# Patient Record
Sex: Female | Born: 1960 | Race: White | Hispanic: No | Smoking: Current every day smoker
Health system: Southern US, Community
[De-identification: ages and names within clinical notes are randomized; demographics above are authoritative.]

## PROBLEM LIST (undated history)

## (undated) DIAGNOSIS — M1990 Unspecified osteoarthritis, unspecified site: Secondary | ICD-10-CM

## (undated) DIAGNOSIS — R9089 Other abnormal findings on diagnostic imaging of central nervous system: Secondary | ICD-10-CM

## (undated) DIAGNOSIS — F419 Anxiety disorder, unspecified: Secondary | ICD-10-CM

## (undated) DIAGNOSIS — S92901A Unspecified fracture of right foot, initial encounter for closed fracture: Secondary | ICD-10-CM

## (undated) DIAGNOSIS — G501 Atypical facial pain: Secondary | ICD-10-CM

## (undated) DIAGNOSIS — M549 Dorsalgia, unspecified: Secondary | ICD-10-CM

## (undated) DIAGNOSIS — G8929 Other chronic pain: Secondary | ICD-10-CM

## (undated) DIAGNOSIS — G90519 Complex regional pain syndrome I of unspecified upper limb: Secondary | ICD-10-CM

## (undated) HISTORY — DX: Anxiety disorder, unspecified: F41.9

## (undated) HISTORY — DX: Unspecified fracture of right foot, initial encounter for closed fracture: S92.901A

## (undated) HISTORY — DX: Unspecified osteoarthritis, unspecified site: M19.90

## (undated) HISTORY — DX: Other abnormal findings on diagnostic imaging of central nervous system: R90.89

## (undated) HISTORY — DX: Dorsalgia, unspecified: M54.9

## (undated) HISTORY — DX: Complex regional pain syndrome I of unspecified upper limb: G90.519

## (undated) HISTORY — DX: Atypical facial pain: G50.1

## (undated) HISTORY — DX: Other chronic pain: G89.29

---

## 2001-09-20 ENCOUNTER — Encounter: Admission: RE | Admit: 2001-09-20 | Discharge: 2001-12-12 | Payer: Self-pay | Admitting: Occupational Medicine

## 2002-09-06 ENCOUNTER — Emergency Department (HOSPITAL_COMMUNITY): Admission: EM | Admit: 2002-09-06 | Discharge: 2002-09-06 | Payer: Self-pay | Admitting: Emergency Medicine

## 2007-03-01 ENCOUNTER — Emergency Department (HOSPITAL_COMMUNITY): Admission: EM | Admit: 2007-03-01 | Discharge: 2007-03-01 | Payer: Self-pay | Admitting: Emergency Medicine

## 2010-06-21 HISTORY — PX: ABDOMINAL HYSTERECTOMY: SHX81

## 2010-08-20 ENCOUNTER — Encounter (HOSPITAL_COMMUNITY)
Admission: RE | Admit: 2010-08-20 | Discharge: 2010-08-20 | Disposition: A | Discharge status: Home / Self Care | Payer: 59 | Source: Ambulatory Visit | Attending: Obstetrics and Gynecology | Admitting: Obstetrics and Gynecology

## 2010-08-20 LAB — CBC
HCT: 45.7 % (ref 36.0–46.0)
MCHC: 34.4 g/dL (ref 30.0–36.0)
RDW: 12.5 % (ref 11.5–15.5)

## 2010-08-20 LAB — BASIC METABOLIC PANEL
Calcium: 9.6 mg/dL (ref 8.4–10.5)
Creatinine, Ser: 0.8 mg/dL (ref 0.4–1.2)
GFR calc non Af Amer: >60 mL/min (ref >60)
Glucose, Bld: 120 mg/dL — ABNORMAL HIGH (ref 70–99)
Sodium: 140 mEq/L (ref 135–145)

## 2010-08-27 ENCOUNTER — Other Ambulatory Visit: Payer: Self-pay | Admitting: Obstetrics and Gynecology

## 2010-08-27 ENCOUNTER — Ambulatory Visit (HOSPITAL_COMMUNITY)
Admission: RE | Admit: 2010-08-27 | Discharge: 2010-08-27 | Disposition: A | Discharge status: Home / Self Care | Payer: 59 | Source: Ambulatory Visit | Attending: Obstetrics and Gynecology | Admitting: Obstetrics and Gynecology

## 2010-08-27 DIAGNOSIS — Z01818 Encounter for other preprocedural examination: Secondary | ICD-10-CM | POA: Insufficient documentation

## 2010-08-27 DIAGNOSIS — D259 Leiomyoma of uterus, unspecified: Secondary | ICD-10-CM | POA: Insufficient documentation

## 2010-08-27 DIAGNOSIS — N831 Corpus luteum cyst of ovary, unspecified side: Secondary | ICD-10-CM | POA: Insufficient documentation

## 2010-08-27 DIAGNOSIS — N8000 Endometriosis of the uterus, unspecified: Secondary | ICD-10-CM | POA: Insufficient documentation

## 2010-08-27 DIAGNOSIS — N949 Unspecified condition associated with female genital organs and menstrual cycle: Secondary | ICD-10-CM | POA: Insufficient documentation

## 2010-08-27 DIAGNOSIS — N946 Dysmenorrhea, unspecified: Secondary | ICD-10-CM | POA: Insufficient documentation

## 2010-08-27 DIAGNOSIS — I10 Essential (primary) hypertension: Secondary | ICD-10-CM | POA: Insufficient documentation

## 2010-08-27 DIAGNOSIS — N938 Other specified abnormal uterine and vaginal bleeding: Secondary | ICD-10-CM | POA: Insufficient documentation

## 2010-08-27 DIAGNOSIS — Z01812 Encounter for preprocedural laboratory examination: Secondary | ICD-10-CM | POA: Insufficient documentation

## 2010-08-27 LAB — PREGNANCY, URINE: Preg Test, Ur: NEGATIVE

## 2010-09-11 NOTE — Op Note (Signed)
[name], [name]                 ACCOUNT NO.:  [id number]  MEDICAL RECORD NO.:  [id number]           PATIENT TYPE:  O  LOCATION:  9320                          FACILITY:  [facility]  PHYSICIAN:  Zenaida Niece, M.D.DATE OF BIRTH:  [birth year]  DATE OF PROCEDURE:  08/27/2010 DATE OF DISCHARGE:  08/27/2010                              OPERATIVE REPORT   PREOPERATIVE DIAGNOSES:  Abnormal uterine bleeding and dysmenorrhea.  POSTOPERATIVE DIAGNOSES:  Abnormal uterine bleeding and dysmenorrhea and right ovarian lesion.  PROCEDURE:  Laparoscopic supracervical hysterectomy and removal of right ovarian lesion.  SURGEON:  Zenaida Niece, MD  ASSISTANT:  Huel Cote, MD  ANESTHESIA:  General endotracheal tube.  FINDINGS:  She had a small uterus with normal pelvis except for a 1-2 cm lesion on the end of the right ovary.  SPECIMENS:  Morcellated uterus and right ovarian lesion sent for routine pathology.  ESTIMATED BLOOD LOSS:  Minimal.  COMPLICATIONS:  None.  PROCEDURE IN DETAIL:  The patient was taken to the operating room and placed in the dorsal supine position.  General anesthesia was induced. Both arms were tucked to her sides and legs were placed in mobile stirrups.  Abdomen, perineum, and vagina were then prepped and draped in the usual sterile fashion and a Foley catheter was inserted. Infraumbilical skin was infiltrated with 0.25% Marcaine and a 1-cm vertical incision was made.  The Veress needle was inserted into the peritoneal cavity and placement confirmed by the water drop test and an opening pressure of 6 mmHg.  CO2 was insufflated to a pressure of 12 mmHg and the Veress needle was removed.  A 5-mm trocar was then introduced with direct visualization with the laparoscope.  A 5-mm port was then placed on the right side and a 12-mm port on the left side also under direct visualization.  Inspection revealed the above-mentioned findings.  The left uterine cornu  was grasped with a 5-mm tenaculum from the right side.  The Harmonic scalpel Ace was used to take down the left fallopian tube, left utero-ovarian pedicle, round ligament, broad ligament, and incised across the anterior portion of the uterus. Uterine artery was skeletonized and also taken down with Harmonic scalpel Ace with good hemostasis and good vision.  All bleeding was controlled with Harmonic scalpel Ace.  A similar procedure was performed on the right side taking down the fallopian tube, utero-ovarian pedicle, round ligament, broad ligament, and incising the anterior peritoneum to meet the incision coming from the left side.  Uterine artery was skeletonized and taken down with Harmonic scalpel Ace with adequate division and adequate hemostasis.  A drill, clamped, cut technique was then used with the Harmonic scalpel on maximum power to start removing the uterus from the cervix.  Two bites were taken from the right side. The remainder of bites were taken from the left side, separating the uterus from the cervix.  Small amount of bleeding was controlled with the Harmonic scalpel.  The uterus was placed in the posterior cul-de- sac.  I grasped the distal right fallopian tube with a grasper from the patient's left side.  Using the Harmonic scalpel Ace from the right side, Dr. Senaida Ores was able to remove the ovarian lesion from the end of the right ovary.  Bleeding was controlled with the Harmonic scalpel Ace.  This lesion was then removed through the 12-mm trocar and sent as a separate specimen.  The 12-mm trocar was then removed and the Morcellator was inserted.  The uterus was then morcellated in one piece removing all visible tissue.  The Morcellator was removed and the 12-mm port was replaced.  The pelvis was copiously irrigated and all sites were found to be hemostatic.  There was noted be a small amount of adhesion of the colon to the right lower abdomen.  Some of this  was taken down with Harmonic scalpel Ace.  A piece of Intercede was then placed over the cervical stump.  This noted to be hemostatic.  The 12-mm port was then removed.  A figure-of-eight of 0 Vicryl was placed to reapproximate the fascia while Dr. Senaida Ores observed through the laparoscope to make sure no bowel was included.  This achieved good closure.  The 5-mm trocars were then removed, after gas was allowed to deflate from the abdomen.  Skin incisions were closed with interrupted subcuticular sutures of 4-0 Vicryl followed by Dermabond.  The patient tolerated the procedure well, was extubated and taken to the recovery room in stable condition.  All counts were correct, she received Ancef 1 g IV at the beginning of the procedure and had PAS hose on throughout the procedure.     Zenaida Niece, M.D.     TDM/MEDQ  D:  08/27/2010  T:  08/28/2010  Job:  [id number]  Electronically Signed by Lavina Hamman M.D. on 09/11/2010 08:53:42 AM

## 2010-09-11 NOTE — H&P (Signed)
  [name], [name]                 ACCOUNT NO.:  [id number]  MEDICAL RECORD NO.:  [id number]         PATIENT TYPE:  WAMB  LOCATION:                                [facility]:  [facility]  PHYSICIAN:  Zenaida Niece, M.D.DATE OF BIRTH:  1960/08/25  DATE OF ADMISSION:  08/27/2010 DATE OF DISCHARGE:                             HISTORY & PHYSICAL   CHIEF COMPLAINT:  Abnormal uterine bleeding and dysmenorrhea.  HISTORY OF PRESENT ILLNESS:  This is a 50 year old gravida 0 who was seen for an annual exam in December 2007 by our nurse practitioner.  At that time, she stated that she was having regular periods that were heavy with severe cramping and clotting.  Every 3 months as so, she would have two cycles in 1 month.  Exam at that time was normal. Options were discussed with the patient and she has decided that instead of undergoing endometrial ablation or using medical therapy, she would like a definitive surgical therapy for this.  She did have a pelvic ultrasound which reveals a normal endometrial stripe as I was unable to do an endometrial biopsy in the office.  Plan is to admit the patient on March 8 for a laparoscopic supracervical hysterectomy.  PAST MEDICAL HISTORY:  Borderline hypertension.  PAST SURGICAL HISTORY:  None.  ALLERGIES:  None.  CURRENT MEDICATIONS:  None.  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  FAMILY HISTORY:  No GYN or colon cancer.  REVIEW OF SYSTEMS:  Otherwise negative.  PHYSICAL EXAMINATION:  GENERAL:  This is a well-developed female in no acute distress.  Weight in the office was 137 pounds.  Height is 5 feet 6 inches. NECK:  Supple without lymphadenopathy or thyromegaly. LUNGS:  Clear to auscultation. HEART:  Regular rate and rhythm without murmur. ABDOMEN:  Soft, nontender, nondistended without any palpable masses. EXTREMITIES:  No edema and are  nontender. PELVIC:  External genitalia has no lesions.  Vagina and cervix are normal.  On bimanual exam, uterus is mid planar, nontender, and normal size.  This is confirmed by rectovaginal exam.  ASSESSMENT:  Menorrhagia and dysmenorrhea that has not responded to conservative measures.  All nonsurgical and surgical options have been discussed with the patient and the patient wishes to proceed with definitive surgical therapy with hysterectomy.  All routes of surgery and risks of surgery have been discussed including bleeding, infection, damage to surrounding organs.  The patient wishes to proceed with definitive therapy.  I was unable to do an endometrial biopsy. However, ultrasound reveals a normal endometrial stripe.  Plan is to admit the patient on August 27, 2010 for laparoscopic supracervical hysterectomy.     Zenaida Niece, M.D.     TDM/MEDQ  D:  08/26/2010  T:  08/26/2010  Job:  [id number]  Electronically Signed by Lavina Hamman M.D. on 09/11/2010 08:53:39 AM

## 2011-11-11 ENCOUNTER — Emergency Department: Payer: Self-pay | Admitting: Emergency Medicine

## 2013-01-03 ENCOUNTER — Encounter: Payer: Self-pay | Admitting: Neurology

## 2013-01-03 ENCOUNTER — Ambulatory Visit (INDEPENDENT_AMBULATORY_CARE_PROVIDER_SITE_OTHER): Payer: 59 | Admitting: Neurology

## 2013-01-03 VITALS — BP 153/93 | HR 89 | Ht 66.0 in | Wt 138.0 lb

## 2013-01-03 DIAGNOSIS — G501 Atypical facial pain: Secondary | ICD-10-CM

## 2013-01-03 MED ORDER — GABAPENTIN 300 MG PO CAPS: 300.0000 mg | ORAL_CAPSULE | Freq: Two times a day (BID) | ORAL | Status: DC

## 2013-01-03 NOTE — Progress Notes (Signed)
Reason for visit: Left face pain  [name] is a 52 y.o. female  History of present illness:  [name] is a 52 year old right-handed white female with a history of left maxillary pain that has been present for 16 months. The patient indicates that she does not have superficial pain, but the pain feels as if it is in the anterior maxillary area on the left. The patient had an abscessed tooth, and this was treated. The patient has had multiple dental procedures that includes a root canal and eventual extraction of the tooth. The patient has continued to have pain. The patient indicates that she is sensitive on the roof of the mouth with local pressure, and she has a sensation of pain in the left anterior maxillary area that may radiate into the left angle of the jaw. The patient has pain all the time, but the pain is worse when she is active, better when she rests. There is no sharp, jabbing, or electric quality to the pain. The pain is a burning sensation. The patient gets better with the pain when she is on antibiotics, but the pain comes back as soon as she stops the medication. The patient has had a Panorex view of the jaw, but this did not show any evidence of ongoing infection. The patient is sent to this office for further evaluation. The patient reports no other symptoms of blurred vision, difficulty with speech or swallowing, or numbness or weakness of the face, arms, or legs. The patient denies any problems controlling the bowels or the bladder, and she denies any balance issues. The patient has had no neck stiffness. The patient been treated with nonsteroidal anti-inflammatory medications without benefit.  Past Medical History  Diagnosis Date  . Arthritis   . Chronic back pain   . Anxiety   . Atypical facial pain     Past Surgical History  Procedure Laterality Date  . Abdominal hysterectomy  2012    Family History  Problem Relation Age of Onset  . Coronary artery disease  Father   . Prostate cancer Sister   . Hypertension Brother   . Congestive Heart Failure Mother     Social history:  reports that she has been smoking Cigarettes.  She has been smoking about 2.00 packs per day. She does not have any smokeless tobacco history on file. She reports that  drinks alcohol. She reports that she does not use illicit drugs.  Medications:  No current outpatient prescriptions on file prior to visit.   No current facility-administered medications on file prior to visit.    Allergies: No Known Allergies  ROS:  Out of a complete 14 system review of symptoms, the patient complains only of the following symptoms, and all other reviewed systems are negative.  Fatigue Eczema Feeling hot, flushing Joint pain, joint swelling, achy muscles Anxiety, insomnia, decreased energy  Blood pressure 153/93, pulse 89, height 5\' 6"  (1.676 m), weight 138 lb (62.596 kg).  Physical Exam  General: The patient is alert and cooperative at the time of the examination.  Head: Pupils are equal, round, and reactive to light. Discs are flat bilaterally.  Neck: The neck is supple, no carotid bruits are noted.  Respiratory: The respiratory examination is clear.  Cardiovascular: The cardiovascular examination reveals a regular rate and rhythm, no obvious murmurs or rubs are noted.  Neuromuscular: The patient has good range of movement of the cervical spine. There is no crepitus within the temporomandibular joints.  Skin:  Extremities are without significant edema.  Neurologic Exam  Mental status:  Cranial nerves: Facial symmetry is present. There is good sensation of the face to pinprick and soft touch bilaterally. The strength of the facial muscles and the muscles to head turning and shoulder shrug are normal bilaterally. Speech is well enunciated, no aphasia or dysarthria is noted. Extraocular movements are full. Visual fields are full.  Motor: The motor testing reveals 5 over 5  strength of all 4 extremities. Good symmetric motor tone is noted throughout.  Sensory: Sensory testing is intact to pinprick, soft touch, vibration sensation, and position sense on all 4 extremities. No evidence of extinction is noted.  Coordination: Cerebellar testing reveals good finger-nose-finger and heel-to-shin bilaterally.  Gait and station: Gait is normal. Tandem gait is normal. Romberg is negative. No drift is seen.  Reflexes: Deep tendon reflexes are symmetric and normal bilaterally. Toes are downgoing bilaterally.   Assessment/Plan:  1. Atypical left facial pain  The patient actually appears to have pain in the bone of the maxillary area on the left. The patient will be set up for MRI evaluation of the brain with and without gadolinium enhancement. The pain may not be neuropathic in nature. The patient will be given a trial on gabapentin. The patient indicates that she cannot tolerate steroid therapy. The patient will followup in 3 months.  Marlan Palau MD 01/03/2013 7:08 PM  [facility] [address] [place], [place] [address]  Phone [phone] Fax [phone]

## 2013-01-04 ENCOUNTER — Ambulatory Visit: Payer: 59 | Admitting: Neurology

## 2013-01-10 ENCOUNTER — Ambulatory Visit (INDEPENDENT_AMBULATORY_CARE_PROVIDER_SITE_OTHER): Payer: 59

## 2013-01-10 DIAGNOSIS — G501 Atypical facial pain: Secondary | ICD-10-CM

## 2013-01-11 ENCOUNTER — Telehealth: Payer: Self-pay | Admitting: Neurology

## 2013-01-11 DIAGNOSIS — R9089 Other abnormal findings on diagnostic imaging of central nervous system: Secondary | ICD-10-CM

## 2013-01-11 MED ORDER — GABAPENTIN 300 MG PO CAPS: 300.0000 mg | ORAL_CAPSULE | Freq: Three times a day (TID) | ORAL | Status: DC

## 2013-01-11 NOTE — Telephone Encounter (Signed)
I called patient. I'll call back later. MRI the brain shows some white matter abnormalities. This combined with an atypical facial pain could be suggestive of demyelinating disease. The patient may require lumbar puncture and further blood work. I will need to discuss this with her.

## 2013-01-11 NOTE — Telephone Encounter (Signed)
I called patient. The MRI study shows white matter abnormalities that are nonspecific. Given her history and age, I would recommend pursuing further blood work and a lumbar puncture. The patient indicates that the gabapentin is helping the pain some, but it wears off. I will go up to 300 mg times daily on the gabapentin.

## 2013-01-12 ENCOUNTER — Telehealth: Payer: Self-pay | Admitting: Neurology

## 2013-01-15 ENCOUNTER — Telehealth: Payer: Self-pay | Admitting: Neurology

## 2013-01-15 MED ORDER — GADOPENTETATE DIMEGLUMINE 469.01 MG/ML IV SOLN: 12.0000 mL | Freq: Once | INTRAVENOUS | Status: AC | PRN

## 2013-01-15 NOTE — Telephone Encounter (Signed)
I called and spoke with the patient's sister and I explain to her that dr. Anne Hahn is recommending further testing such as blood work and lumbar puncture. Patient's sister stated if this testing was going to give a diagnosis then her sister(patient) wants to have it done. Please call patient back around 4:00 this afternoon per sister's request.

## 2013-01-15 NOTE — Telephone Encounter (Signed)
I called patient. She was calling to try to get the spinal tap set up. The orders are in place. Someone called this morning to try to schedule this, and she missed the call.

## 2013-01-18 NOTE — Telephone Encounter (Signed)
I called and spoke with patient to confirm appt. for LP on 01-19-13@11 :45.

## 2013-01-19 ENCOUNTER — Ambulatory Visit (INDEPENDENT_AMBULATORY_CARE_PROVIDER_SITE_OTHER): Payer: 59 | Admitting: Neurology

## 2013-01-19 DIAGNOSIS — R930 Abnormal findings on diagnostic imaging of skull and head, not elsewhere classified: Secondary | ICD-10-CM

## 2013-01-19 DIAGNOSIS — G501 Atypical facial pain: Secondary | ICD-10-CM

## 2013-01-19 DIAGNOSIS — R9089 Other abnormal findings on diagnostic imaging of central nervous system: Secondary | ICD-10-CM

## 2013-01-19 MED ORDER — GABAPENTIN 300 MG PO CAPS: 300.0000 mg | ORAL_CAPSULE | Freq: Three times a day (TID) | ORAL | Status: DC

## 2013-01-19 NOTE — Procedures (Signed)
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 The patient has undergone MRI evaluation the brain that shows nonspecific white matter abnormalities. The patient returns to the office for lumbar puncture to evaluate her for the white matter abnormalities. The patient is being evaluated for possible demyelinating disease.  The patient was placed in the fetal position on the right side, and the low back was cleaned with Betadine solution. Approximately 2 cc of 1% Xylocaine was used as a local anesthetic. A 20-gauge spinal needle was inserted into the L3-4 interspace, and approximately 18 cc of clear colorless spinal fluid was removed for testing. Opening pressure was 114 mm of water.  Tube #1 was sent for cryptococcal antigen, VDRL, and angiotensin-converting enzyme level.  Tube #2 was sent for oligoclonal banding, IgG albumin ratio.  Tube #3 was sent for cells, differential, glucose, and protein.  Tube #4 was sent for Lyme antibody panel.  The patient tolerated the procedure well. No complications of the above procedure were noted.

## 2013-01-20 LAB — CELL COUNT, CSF: RBC, CSF: 0 /uL

## 2013-01-20 LAB — GLUCOSE, CEREBROSPINAL FLUID: Glucose, CSF: 76 mg/dL — ABNORMAL HIGH (ref 40–70)

## 2013-01-23 ENCOUNTER — Other Ambulatory Visit: Payer: Self-pay | Admitting: Neurology

## 2013-01-23 ENCOUNTER — Telehealth: Payer: Self-pay | Admitting: Neurology

## 2013-01-23 DIAGNOSIS — G971 Other reaction to spinal and lumbar puncture: Secondary | ICD-10-CM

## 2013-01-23 DIAGNOSIS — R519 Headache, unspecified: Secondary | ICD-10-CM

## 2013-01-23 LAB — CARDIOLIPIN ANTIBODY
Anticardiolipin IgA: <9 APL U/mL (ref 0–11)
Anticardiolipin IgM: <9 MPL U/mL (ref 0–12)

## 2013-01-23 LAB — ANGIOTENSIN CONVERTING ENZYME: Angio Convert Enzyme: 42 U/L (ref 14–82)

## 2013-01-23 LAB — FACTOR 5 LEIDEN

## 2013-01-23 LAB — RHEUMATOID FACTOR: Rhuematoid fact SerPl-aCnc: 8.8 IU/mL (ref 0.0–13.9)

## 2013-01-23 NOTE — Progress Notes (Signed)
The patient has called with a severe spinal headache. I'll set up a blood patch.

## 2013-01-23 NOTE — Telephone Encounter (Signed)
Spoke to [facility] and patient is going for blood patch tomorrow at 1300.

## 2013-01-23 NOTE — Telephone Encounter (Signed)
Patient left message that she had a LP on Friday and now has a terrible headache.  She was told to call if bad headache occurred.  I have tried to reach her on [phone] and [phone] but was only able to leave message.

## 2013-01-24 ENCOUNTER — Ambulatory Visit
Admission: RE | Admit: 2013-01-24 | Discharge: 2013-01-24 | Disposition: A | Discharge status: Home / Self Care | Payer: 59 | Source: Ambulatory Visit | Attending: Neurology | Admitting: Neurology

## 2013-01-24 VITALS — BP 141/84 | HR 67

## 2013-01-24 DIAGNOSIS — G971 Other reaction to spinal and lumbar puncture: Secondary | ICD-10-CM

## 2013-01-24 DIAGNOSIS — R519 Headache, unspecified: Secondary | ICD-10-CM

## 2013-01-24 LAB — IGG CSF INDEX
Albumin CSF-mCnc: 22 mg/dL (ref 11–48)
Albumin: 4.7 g/dL (ref 3.5–5.5)
IgG (Immunoglobin G), Serum: 682 mg/dL — ABNORMAL LOW (ref 700–1600)
IgG Index, CSF: 0.1 (ref 0.0–0.7)
IgG/Albumin Ratio, CSF: 0.01 (ref 0.00–0.25)

## 2013-01-24 LAB — ANGIOTENSIN CONVERTING ENZYME, CSF: Angio Convert Enzyme, CSF: 1.3 U/L (ref 0.0–2.5)

## 2013-01-24 MED ORDER — HYDROCODONE-ACETAMINOPHEN 5-325 MG PO TABS
2.0000 | ORAL_TABLET | Freq: Once | ORAL | Status: AC
  Administered 2013-01-24: 2 via ORAL

## 2013-01-24 MED ORDER — IOHEXOL 180 MG/ML  SOLN
1.0000 mL | Freq: Once | INTRAMUSCULAR | Status: AC | PRN
  Administered 2013-01-24: 1 mL via EPIDURAL

## 2013-01-25 ENCOUNTER — Telehealth: Payer: Self-pay | Admitting: Neurology

## 2013-01-25 LAB — LYME, WESTERN BLOT, CSF
Lyme IgG WB Interp.: NEGATIVE
P18 Ab.: ABSENT
P23 Ab.: ABSENT
P28 Ab.: ABSENT
P30 Ab.: ABSENT
P58 Ab.: ABSENT

## 2013-01-25 NOTE — Telephone Encounter (Signed)
I called the patient. The blood work and spinal fluid results are unremarkable. We may consider doing a transcranial Doppler bubble study in the future to look for a PFO as the source of the white matter lesions of the brain. The MRI of the brain should be followed over time.

## 2013-01-26 ENCOUNTER — Other Ambulatory Visit: Payer: 59

## 2013-05-15 ENCOUNTER — Other Ambulatory Visit: Payer: Self-pay | Admitting: Neurology

## 2013-07-26 ENCOUNTER — Encounter: Payer: Self-pay | Admitting: Neurology

## 2013-07-26 ENCOUNTER — Encounter (INDEPENDENT_AMBULATORY_CARE_PROVIDER_SITE_OTHER): Payer: Self-pay

## 2013-07-26 ENCOUNTER — Ambulatory Visit (INDEPENDENT_AMBULATORY_CARE_PROVIDER_SITE_OTHER): Payer: 59 | Admitting: Neurology

## 2013-07-26 VITALS — BP 161/93 | HR 85 | Wt 141.0 lb

## 2013-07-26 DIAGNOSIS — G501 Atypical facial pain: Secondary | ICD-10-CM

## 2013-07-26 DIAGNOSIS — R9089 Other abnormal findings on diagnostic imaging of central nervous system: Secondary | ICD-10-CM

## 2013-07-26 DIAGNOSIS — R930 Abnormal findings on diagnostic imaging of skull and head, not elsewhere classified: Secondary | ICD-10-CM

## 2013-07-26 HISTORY — DX: Other abnormal findings on diagnostic imaging of central nervous system: R90.89

## 2013-07-26 MED ORDER — DULOXETINE HCL 60 MG PO CPEP: 60.0000 mg | ORAL_CAPSULE | Freq: Every day | ORAL | Status: DC

## 2013-07-26 NOTE — Progress Notes (Signed)
Reason for visit: Atypical facial pain, left  [name] is an 53 y.o. female  History of present illness:  Ms. Herbison is a 53 year old right-handed white female with a two-year history of atypical facial pain on the left following dental procedure. The patient has been noted to have an abnormal MRI of the brain with rounded white matter lesions. Lumbar puncture done previously did not show any abnormalities. The patient has been placed on gabapentin and she believes that this does help the pain some, but the medication effects wear off after 2 hours. The patient is unable to go higher on the dose as the medication makes her jittery. The patient denies any new numbness, weakness, balance problems, or problems controlling the bowels or the bladder. There have been no significant vision changes. The patient returns to this office for further evaluation. The patient does report some numbness around the left cheek.  Past Medical History  Diagnosis Date  . Arthritis   . Chronic back pain   . Anxiety   . Atypical facial pain   . Foot fracture, right   . Abnormal brain MRI 07/26/2013    Past Surgical History  Procedure Laterality Date  . Abdominal hysterectomy  2012    Family History  Problem Relation Age of Onset  . Coronary artery disease Father   . Prostate cancer Sister   . Hypertension Brother   . Congestive Heart Failure Mother     Social history:  reports that she has been smoking Cigarettes.  She has been smoking about 2.00 packs per day. She has never used smokeless tobacco. She reports that she drinks alcohol. She reports that she does not use illicit drugs.   No Known Allergies  Medications:  Current Outpatient Prescriptions on File Prior to Visit  Medication Sig Dispense Refill  . gabapentin (NEURONTIN) 300 MG capsule TAKE ONE CAPSULE BY MOUTH 3 TIMES A DAY  90 capsule  3  . meloxicam (MOBIC) 7.5 MG tablet Take 7.5 mg by mouth 2 (two) times daily.       .  sertraline (ZOLOFT) 25 MG tablet Take 25 mg by mouth daily.      Marland Kitchen triamcinolone cream (KENALOG) 0.1 % Apply 1 application topically 2 (two) times daily.        No current facility-administered medications on file prior to visit.    ROS:  Out of a complete 14 system review of symptoms, the patient complains only of the following symptoms, and all other reviewed systems are negative.  Excessive sweating Facial swelling Visual blurring Back pain Headache, numbness  Blood pressure 161/93, pulse 85, weight 141 lb (63.957 kg).  Physical Exam  General: The patient is alert and cooperative at the time of the examination.  Skin: No significant peripheral edema is noted.   Neurologic Exam  Mental status: The patient is oriented x 3.  Cranial nerves: Facial symmetry is present. Speech is normal, no aphasia or dysarthria is noted. Extraocular movements are full. Visual fields are full. Pupils are equal, round, and reactive to light. Discs are flat bilaterally.  Motor: The patient has good strength in all 4 extremities.  Sensory examination: Soft touch sensation on the face, arms, and legs is symmetric.  Coordination: The patient has good finger-nose-finger and heel-to-shin bilaterally.  Gait and station: The patient has a normal gait. Tandem gait is normal. Romberg is negative. No drift is seen.  Reflexes: Deep tendon reflexes are symmetric.   Assessment/Plan:  1. Atypical facial  pain, left  2. Abnormal MRI brain  The patient will be sent for a repeat MRI of the brain to compare to the one done in July of 2014. If progression of abnormalities are noted, the patient will undergo a transcranial Doppler bubble study and be considered for possible therapy for multiple sclerosis. The patient will be placed on Cymbalta for ongoing discomfort. Samples were given. The patient will followup in 6 months. The patient will contact our office if the medication is not adequate to control her  pain.  Jill Alexanders MD 07/26/2013 7:20 PM  [facility] [address] [place], [place] [address]  Phone [phone] Fax [phone]

## 2013-08-27 ENCOUNTER — Ambulatory Visit
Admission: RE | Admit: 2013-08-27 | Discharge: 2013-08-27 | Disposition: A | Discharge status: Home / Self Care | Payer: 59 | Source: Ambulatory Visit | Attending: Neurology | Admitting: Neurology

## 2013-08-27 DIAGNOSIS — G501 Atypical facial pain: Secondary | ICD-10-CM

## 2013-08-27 DIAGNOSIS — R930 Abnormal findings on diagnostic imaging of skull and head, not elsewhere classified: Secondary | ICD-10-CM

## 2013-08-27 DIAGNOSIS — R9089 Other abnormal findings on diagnostic imaging of central nervous system: Secondary | ICD-10-CM

## 2013-08-28 ENCOUNTER — Telehealth: Payer: Self-pay | Admitting: Neurology

## 2013-08-28 NOTE — Telephone Encounter (Signed)
I called patient. MRI study of the brain does show a moderate level white matter changes in the paraventricular areas. No comparison to the prior study done at [facility] in July 2014. I will try to get comparison done.

## 2013-09-03 ENCOUNTER — Telehealth: Payer: Self-pay | Admitting: Neurology

## 2013-09-03 NOTE — Telephone Encounter (Signed)
I called patient. The most recent MRI the brain that was done appears to be little change from the study done in July 2014. The lesions appear to be stable.  ADDENDUM:  In comparison to prior MRI from 01/10/13, there are minor differences in the appear of the white matter lesions, most likely due to differences in slice acquisition and technical factors. There is some suggestion of slight increase in white matter disease in the current study (axial T2FLAIR views), but on sagittal T2FLAIR the 2 studies are fairly similar.

## 2013-09-15 ENCOUNTER — Other Ambulatory Visit: Payer: Self-pay | Admitting: Neurology

## 2013-11-25 ENCOUNTER — Other Ambulatory Visit: Payer: Self-pay | Admitting: Neurology

## 2013-12-06 ENCOUNTER — Ambulatory Visit (INDEPENDENT_AMBULATORY_CARE_PROVIDER_SITE_OTHER): Payer: 59 | Admitting: Neurology

## 2013-12-06 ENCOUNTER — Ambulatory Visit (INDEPENDENT_AMBULATORY_CARE_PROVIDER_SITE_OTHER): Payer: Self-pay

## 2013-12-06 DIAGNOSIS — M79601 Pain in right arm: Secondary | ICD-10-CM

## 2013-12-06 DIAGNOSIS — Z0289 Encounter for other administrative examinations: Secondary | ICD-10-CM

## 2013-12-06 DIAGNOSIS — M79609 Pain in unspecified limb: Secondary | ICD-10-CM

## 2013-12-06 NOTE — Procedures (Signed)
     HISTORY:  [name] is a 53 year old patient with a history with a history of right wrist and finger swelling and discomfort that began in April of 2015. The patient is being evaluated for possible neuropathy or a cervical radiculopathy as the etiology for her current symptoms.  NERVE CONDUCTION STUDIES:  Nerve conduction studies were performed on both upper extremities. The distal motor latencies and motor amplitudes for the median and ulnar nerves were within normal limits. The F wave latencies and nerve conduction velocities for these nerves were also normal. The sensory latencies for the median and ulnar nerves were normal.   EMG STUDIES:  EMG study was performed on the right upper extremity:  The first dorsal interosseous muscle reveals 2 to 4 K units with full recruitment. No fibrillations or positive waves were noted. The abductor pollicis brevis muscle reveals 2 to 4 K units with full recruitment. No fibrillations or positive waves were noted. The extensor indicis proprius muscle reveals 1 to 3 K units with full recruitment. No fibrillations or positive waves were noted. The pronator teres muscle reveals 2 to 3 K units with full recruitment. No fibrillations or positive waves were noted. The biceps muscle reveals 1 to 2 K units with full recruitment. No fibrillations or positive waves were noted. The triceps muscle reveals 2 to 4 K units with full recruitment. No fibrillations or positive waves were noted. The anterior deltoid muscle reveals 2 to 3 K units with full recruitment. No fibrillations or positive waves were noted. The cervical paraspinal muscles were tested at 2 levels. No abnormalities of insertional activity were seen at either level tested. There was good relaxation.   IMPRESSION:  Nerve conduction studies done on both upper extremities were within normal limits. No evidence of a neuropathy is seen. EMG evaluation of the right upper extremity is normal,  without evidence of an overlying cervical radiculopathy.  Jill Alexanders MD 12/06/2013 10:53 AM  [facility] [address] [place], [place] [address]  Phone [phone] Fax [phone]

## 2013-12-18 ENCOUNTER — Other Ambulatory Visit: Payer: Self-pay | Admitting: Rheumatology

## 2013-12-18 DIAGNOSIS — M25531 Pain in right wrist: Secondary | ICD-10-CM

## 2014-01-01 ENCOUNTER — Ambulatory Visit
Admission: RE | Admit: 2014-01-01 | Discharge: 2014-01-01 | Disposition: A | Discharge status: Home / Self Care | Payer: 59 | Source: Ambulatory Visit | Attending: Rheumatology | Admitting: Rheumatology

## 2014-01-01 DIAGNOSIS — M25531 Pain in right wrist: Secondary | ICD-10-CM

## 2014-01-07 ENCOUNTER — Other Ambulatory Visit: Payer: Self-pay | Admitting: Neurology

## 2014-02-14 ENCOUNTER — Ambulatory Visit: Payer: 59 | Admitting: Neurology

## 2014-03-03 ENCOUNTER — Other Ambulatory Visit: Payer: Self-pay | Admitting: Neurology

## 2014-03-07 ENCOUNTER — Ambulatory Visit (INDEPENDENT_AMBULATORY_CARE_PROVIDER_SITE_OTHER): Payer: 59 | Admitting: Neurology

## 2014-03-07 ENCOUNTER — Encounter: Payer: Self-pay | Admitting: Neurology

## 2014-03-07 ENCOUNTER — Encounter (INDEPENDENT_AMBULATORY_CARE_PROVIDER_SITE_OTHER): Payer: Self-pay

## 2014-03-07 VITALS — BP 146/84 | HR 69 | Wt 158.0 lb

## 2014-03-07 DIAGNOSIS — R9089 Other abnormal findings on diagnostic imaging of central nervous system: Secondary | ICD-10-CM

## 2014-03-07 DIAGNOSIS — G90511 Complex regional pain syndrome I of right upper limb: Secondary | ICD-10-CM

## 2014-03-07 DIAGNOSIS — G501 Atypical facial pain: Secondary | ICD-10-CM

## 2014-03-07 DIAGNOSIS — R930 Abnormal findings on diagnostic imaging of skull and head, not elsewhere classified: Secondary | ICD-10-CM

## 2014-03-07 DIAGNOSIS — G90519 Complex regional pain syndrome I of unspecified upper limb: Secondary | ICD-10-CM

## 2014-03-07 HISTORY — DX: Complex regional pain syndrome I of unspecified upper limb: G90.519

## 2014-03-07 MED ORDER — TOPIRAMATE 25 MG PO TABS: ORAL_TABLET | ORAL | Status: DC

## 2014-03-07 NOTE — Patient Instructions (Signed)

## 2014-03-07 NOTE — Progress Notes (Signed)
Reason for visit: Atypical facial pain  [name] is an 53 y.o. 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 The patient was placed on Cymbalta, and she seemed to get benefit with the medication, but she had significant weight gain on the drug, and within the last month, she has tapered herself off of the medication. She has gained 17 pounds while on the medication. The patient unfortunately has developed what is felt to be a complex regional pain syndrome in April, 2015 involving the right upper extremity, mainly the hand and wrist. The patient is being followed by Dr. Vira Blanco, a pain physician, and she is receiving sympathetic nerve blocks for the complex regional pain syndrome. So far, this has not been completely effective. The patient is on codeine for pain as well, and this does help some. The patient returns to this office for an evaluation. The patient did have nerve conduction studies and EMG evaluation previously that were unremarkable. She indicates that she did have MRI evaluation of the cervical spine, but she does not remember where this study was done. The patient indicates that she was told that the MRI was unremarkable. She returns to this office for an evaluation.  Past Medical History  Diagnosis Date  . Arthritis   . Chronic back pain   . Anxiety   . Atypical facial pain   . Foot fracture, right   . Abnormal brain MRI 07/26/2013  . 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 Abdominal hysterectomy  2012    Family History  Problem Relation Age of Onset  . Coronary artery disease Father   . Prostate cancer Sister   . Hypertension Brother   . Congestive Heart Failure Mother     Social history:  reports that she has been smoking Cigarettes.  She has been smoking about  2.00 packs per day. She has never used smokeless tobacco. She reports that she drinks alcohol. She reports that she does not use illicit drugs.   No Known Allergies  Medications:  Current Outpatient Prescriptions on File Prior to Visit  Medication Sig Dispense Refill  . gabapentin (NEURONTIN) 300 MG capsule TAKE ONE CAPSULE BY MOUTH 3 TIMES A DAY  90 capsule  6  . meloxicam (MOBIC) 7.5 MG tablet Take 7.5 mg by mouth 2 (two) times daily.       . sertraline (ZOLOFT) 25 MG tablet Take 25 mg by mouth daily.      Marland Kitchen triamcinolone cream (KENALOG) 0.1 % Apply 1 application topically 2 (two) times daily.        No current facility-administered medications on file prior to visit.    ROS:  Out of a complete 14 system review of symptoms, the patient complains only of the following symptoms, and all other reviewed systems are negative.  Joint pain, joint swelling  Blood pressure 146/84, pulse 69, weight 158 lb (71.668 kg).  Physical Exam  General: The patient is alert and cooperative at the time of the examination.  Skin: No significant peripheral edema is noted.   Neurologic Exam  Mental status: The patient is oriented x 3.  Cranial nerves: Facial symmetry is present. Speech is normal, no aphasia or dysarthria is noted. Extraocular movements are full. Visual fields are full.  Motor: The  patient has good strength in all 4 extremities.  Sensory examination: Soft touch sensation is symmetric, legs, slight decrease in soft touch sensation on the right hand relative to the left. Soft touch sensation is symmetric on the face.  Coordination: The patient has good finger-nose-finger and heel-to-shin bilaterally.  Gait and station: The patient has a normal gait. Tandem gait is normal. Romberg is negative. No drift is seen.  Reflexes: Deep tendon reflexes are symmetric.   MRI brain 08/28/13:   IMPRESSION:  Abnormal MRI brain (without) demonstrating;  1. Mild-moderate periventricular and  subcortical and juxtacortical T2 hyperintensities. These findings are non-specific and considerations include autoimmune, inflammatory, post-infectious, microvascular ischemic or migraine associated etiologies.  2. No acute findings.    Assessment/Plan:  1. Atypical facial pain, left mandibular area  2. Complex regional pain syndrome, right upper extremity  The patient has had significant weight gain on the Cymbalta. For this reason, she will be switched to Topamax for the pain to see if this is beneficial. The patient will work up to a 75 mg daily dose taking 1 in the morning and 2 in evening. The patient will followup in about 3-4 months. She will contact me if she is not tolerating the medication, or if she feels that the dose needs to be increased.  Jill Alexanders MD 03/07/2014 9:21 AM  [facility] [address] [place], [place] [address]  Phone [phone] Fax [phone]

## 2014-07-01 ENCOUNTER — Other Ambulatory Visit: Payer: Self-pay | Admitting: Neurology

## 2014-07-08 ENCOUNTER — Ambulatory Visit: Payer: 59 | Admitting: Adult Health

## 2014-07-16 ENCOUNTER — Encounter: Payer: Self-pay | Admitting: Adult Health

## 2014-07-16 ENCOUNTER — Ambulatory Visit (INDEPENDENT_AMBULATORY_CARE_PROVIDER_SITE_OTHER): Payer: 59 | Admitting: Adult Health

## 2014-07-16 VITALS — BP 141/82 | HR 62 | Ht 66.0 in | Wt 148.0 lb

## 2014-07-16 DIAGNOSIS — R9089 Other abnormal findings on diagnostic imaging of central nervous system: Secondary | ICD-10-CM

## 2014-07-16 DIAGNOSIS — R930 Abnormal findings on diagnostic imaging of skull and head, not elsewhere classified: Secondary | ICD-10-CM

## 2014-07-16 DIAGNOSIS — G501 Atypical facial pain: Secondary | ICD-10-CM

## 2014-07-16 DIAGNOSIS — G90511 Complex regional pain syndrome I of right upper limb: Secondary | ICD-10-CM

## 2014-07-16 NOTE — Progress Notes (Signed)
PATIENT: [name] DOB: 04-Oct-1960  REASON FOR VISIT: follow up-atypical face pain, regional pain syndrome, abnormal MRI of the brain HISTORY FROM: patient  HISTORY OF PRESENT ILLNESS: [name] is a 54 year old female with a history of atypical face pain and regional pain syndrome in the right upper extremities. She is followed by a pain specialist and has been given sympathetic nerve blocks which has not always been beneficial. She can't have any additional blocks. She has just been taking pain medication NORCO to control the pain. The patient has was placed on Topamax for facial pain and reports that it has helped the pain. She will have intermittent pain but states that it is tolerable. She states that the Norco has also helped with the facial pain. She states that she plans to retire in May and at that time she plans to come off the pain medication. She has just been using it to be able to function at her job. No new medical history since last seen.   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 The patient was placed on Cymbalta, and she seemed to get benefit with the medication, but she had significant weight gain on the drug, and within the last month, she has tapered herself off of the medication. She has gained 17 pounds while on the medication. The patient unfortunately has developed what is felt to be a complex regional pain syndrome in April, 2015 involving the right upper extremity, mainly the hand and wrist. The patient is being followed by Dr. Vira Blanco, a pain physician, and she is receiving sympathetic nerve blocks for the complex regional pain syndrome. So far, this has not been completely effective. The patient is on codeine for pain as well, and this does help some. The patient returns to this office for an evaluation. The patient  did have nerve conduction studies and EMG evaluation previously that were unremarkable. She indicates that she did have MRI evaluation of the cervical spine, but she does not remember where this study was done. The patient indicates that she was told that the MRI was unremarkable. She returns to this office for an evaluation.  REVIEW OF SYSTEMS: Out of a complete 14 system review of symptoms, the patient complains only of the following symptoms, and all other reviewed systems are negative.  Joint pain, joint swelling  ALLERGIES: No Known Allergies  HOME MEDICATIONS: Outpatient Prescriptions Prior to Visit  Medication Sig Dispense Refill  . gabapentin (NEURONTIN) 300 MG capsule TAKE ONE CAPSULE BY MOUTH 3 TIMES A DAY 90 capsule 6  . HYDROcodone-acetaminophen (NORCO) 10-325 MG per tablet Take 4 tablets by mouth daily as needed.    . meloxicam (MOBIC) 7.5 MG tablet Take 7.5 mg by mouth 2 (two) times daily.     . sertraline (ZOLOFT) 25 MG tablet Take 25 mg by mouth daily.    Marland Kitchen topiramate (TOPAMAX) 25 MG tablet 1 TABLET EVERY DAY FOR ONCE WEEK.THEN 1 TAB TWICE A DAY FOR WEEK. 1 IN THE MORNING & 2 IN EVENING 90 tablet 0  . triamcinolone cream (KENALOG) 0.1 % Apply 1 application topically 2 (two) times daily.      No facility-administered medications prior to visit.    PAST MEDICAL HISTORY: Past Medical History  Diagnosis Date  . Arthritis   . Chronic back pain   .  Anxiety   . Atypical facial pain   . Foot fracture, right   . Abnormal brain MRI 07/26/2013  . 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 Abdominal hysterectomy  2012    FAMILY HISTORY: Family History  Problem Relation Age of Onset  . Coronary artery disease Father   . Prostate cancer Sister   . Hypertension Brother   . Congestive Heart Failure Mother        PHYSICAL EXAM  Filed Vitals:   07/16/14 1517  BP: 141/82  Pulse: 62  Height: 5\' 6"  (1.676  m)  Weight: 148 lb (67.132 kg)   Body mass index is 23.9 kg/(m^2).  Generalized: Well developed, in no acute distress   Neurological examination  Mentation: Alert oriented to time, place, history taking. Follows all commands speech and language fluent Cranial nerve II-XII: Pupils were equal round reactive to light. Extraocular movements were full, visual field were full on confrontational test. Facial sensation and strength were normal. Uvula tongue midline. Head turning and shoulder shrug  were normal and symmetric. Motor: The motor testing reveals 5 over 5 strength of all 4 extremities. Good symmetric motor tone is noted throughout.  Sensory: Sensory testing is intact to soft touch on all 4 extremities. No evidence of extinction is noted.  Coordination: Cerebellar testing reveals good finger-nose-finger and heel-to-shin bilaterally.  Gait and station: Gait is normal. Tandem gait is normal. Romberg is negative. No drift is seen.  Reflexes: Deep tendon reflexes are symmetric and normal bilaterally.     DIAGNOSTIC DATA (LABS, IMAGING, TESTING) - I reviewed patient records, labs, notes, testing and imaging myself where available.  MRI BRAIN W Wo CONTRAST 08/28/13: Impression: Abnormal MRI brain (without) demonstrating; 1. Mild-moderate periventricular and subcortical and juxtacortical T2 hyperintensities. These findings are non-specific and considerations include autoimmune, inflammatory, post-infectious, microvascular ischemic or migraine associated etiologies.  2. No acute findings. In comparison to prior MRI from 01/10/13, there are minor differences in the appear of the white matter lesions, most likely due to differences in slice acquisition and technical factors. There is some suggestion of slight increase in white matter disease in the current study (axial T2FLAIR views), but on sagittal T2FLAIR the 2 studies are fairly similar.  ASSESSMENT AND PLAN 54 y.o. year old female  has a past  medical history of Arthritis; Chronic back pain; Anxiety; Atypical facial pain; Foot fracture, right; Abnormal brain MRI (07/26/2013); and RSD upper limb (03/07/2014). here with:   1. Atypical face pain 2.Regional pain syndrome in the right upper extremity 3. Abnormal MRI of the brain  Overall the patient is doing about the same. She is using the Norco to control the pain in the right upper extremity. The patient continues to use Topamax for facial pain. She has found it beneficial. No refills needed today.Patient's MRI of the brain was abnormal but stable in 2015. We will continue to monitor. If her symptoms worsen or she develops new symptoms she should let us know. Otherwise she will follow up in 6 months or sooner if     Ward Givens, MSN, NP-C 07/16/2014, 3:26 PM [facility] [address] [place], [place] [address] [phone]  Note: This document was prepared with digital dictation and possible smart phrase technology. Any transcriptional errors that result from this process are unintentional.

## 2014-07-16 NOTE — Progress Notes (Signed)
I have read the note, and I agree with the clinical assessment and plan.  Aksh Swart KEITH   

## 2014-07-16 NOTE — Patient Instructions (Signed)
Continue Topamax.  In the future we can increase Topamax if needed.  Please call if your symptoms worsen or you develop new symptoms.

## 2014-07-28 ENCOUNTER — Other Ambulatory Visit: Payer: Self-pay | Admitting: Neurology

## 2014-09-29 ENCOUNTER — Other Ambulatory Visit: Payer: Self-pay | Admitting: Neurology

## 2014-09-29 NOTE — Telephone Encounter (Signed)
Originally prescribed at this dose per phone note from 07/24

## 2014-10-29 ENCOUNTER — Other Ambulatory Visit: Payer: Self-pay | Admitting: Neurology

## 2015-01-20 ENCOUNTER — Ambulatory Visit (INDEPENDENT_AMBULATORY_CARE_PROVIDER_SITE_OTHER): Payer: 59 | Admitting: Adult Health

## 2015-01-20 ENCOUNTER — Encounter: Payer: Self-pay | Admitting: Adult Health

## 2015-01-20 VITALS — BP 136/85 | HR 62 | Ht 66.0 in | Wt 142.0 lb

## 2015-01-20 DIAGNOSIS — G501 Atypical facial pain: Secondary | ICD-10-CM

## 2015-01-20 DIAGNOSIS — G90511 Complex regional pain syndrome I of right upper limb: Secondary | ICD-10-CM

## 2015-01-20 MED ORDER — TOPIRAMATE 25 MG PO TABS: 50.0000 mg | ORAL_TABLET | Freq: Two times a day (BID) | ORAL | Status: DC

## 2015-01-20 NOTE — Progress Notes (Signed)
PATIENT: Colleen [name] DOB: 12/26/1960  REASON FOR VISIT: follow up- atypical facial pain, regional pain syndrome HISTORY FROM: patient  HISTORY OF PRESENT ILLNESS: Colleen [name] is a 54 year old female with a history of atypical facial pain and regional pain syndrome in the right upper extremity. She returns today for follow-up. She continues to take Topamax and gabapentin. She reports that this medication has eliminated her facial pain. She does state that usually by mid afternoon the pain will start to come back. The pain is normally located on the left side of the face in the nasal fold. The patient has retired and still uses Norco for severe pain. She states that she has tried to cut back but tends to have severe pain in the right hand. She states that she is considering using a TENS unit. She denies any new symptoms. She returns today for an evaluation.  HISTORY 07/16/14: Colleen [name] is a 54 year old female with a history of atypical face pain and regional pain syndrome in the right upper extremities. She is followed by a pain specialist and has been given sympathetic nerve blocks which has not always been beneficial. She can't have any additional blocks. She has just been taking pain medication NORCO to control the pain. The patient has was placed on Topamax for facial pain and reports that it has helped the pain. She will have intermittent pain but states that it is tolerable. She states that the Norco has also helped with the facial pain. She states that she plans to retire in May and at that time she plans to come off the pain medication. She has just been using it to be able to function at her job. No new medical history since last seen.   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 The patient was placed on Cymbalta, and she seemed to get  benefit with the medication, but she had significant weight gain on the drug, and within the last month, she has tapered herself off of the medication. She has gained 17 pounds while on the medication. The patient unfortunately has developed what is felt to be a complex regional pain syndrome in April, 2015 involving the right upper extremity, mainly the hand and wrist. The patient is being followed by Dr. Vira Blanco, a pain physician, and she is receiving sympathetic nerve blocks for the complex regional pain syndrome. So far, this has not been completely effective. The patient is on codeine for pain as well, and this does help some. The patient returns to this office for an evaluation. The patient did have nerve conduction studies and EMG evaluation previously that were unremarkable. She indicates that she did have MRI evaluation of the cervical spine, but she does not remember where this study was done. The patient indicates that she was told that the MRI was unremarkable. She returns to this office for an evaluation.  REVIEW OF SYSTEMS: Out of a complete 14 system review of symptoms, the patient complains only of the following symptoms, and all other reviewed systems are negative.  See history of present illness  ALLERGIES: Allergies  Allergen Reactions  . Prednisone     HOME MEDICATIONS: Outpatient Prescriptions Prior to Visit  Medication Sig Dispense Refill  . gabapentin (NEURONTIN) 300 MG capsule TAKE ONE CAPSULE BY MOUTH 3 TIMES A DAY 90 capsule 6  .  HYDROcodone-acetaminophen (NORCO) 10-325 MG per tablet Take 4 tablets by mouth daily as needed.    . meloxicam (MOBIC) 7.5 MG tablet Take 7.5 mg by mouth 2 (two) times daily.     Marland Kitchen topiramate (TOPAMAX) 25 MG tablet 1 TABLET EVERY DAY FOR ONCE WEEK.THEN 1 TAB TWICE A DAY FOR WEEK. 1 IN THE MORNING & 2 IN EVENING (Patient taking differently: 1 tab in a.m 2 tabs in evening) 90 tablet 6  . triamcinolone cream (KENALOG) 0.1 % Apply 1 application  topically 2 (two) times daily.     . sertraline (ZOLOFT) 25 MG tablet Take 25 mg by mouth daily.    Marland Kitchen gabapentin (NEURONTIN) 300 MG capsule TAKE ONE CAPSULE BY MOUTH 3 TIMES A DAY 90 capsule 6  . topiramate (TOPAMAX) 25 MG tablet 1 TABLET EVERY DAY FOR ONCE WEEK.THEN 1 TAB TWICE A DAY FOR WEEK. 1 IN THE MORNING & 2 IN EVENING 90 tablet 6   No facility-administered medications prior to visit.    PAST MEDICAL HISTORY: Past Medical History  Diagnosis Date  . Arthritis   . Chronic back pain   . Anxiety   . Atypical facial pain   . Foot fracture, right   . Abnormal brain MRI 07/26/2013  . 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 Abdominal hysterectomy  2012    FAMILY HISTORY: Family History  Problem Relation Age of Onset  . Coronary artery disease Father   . Prostate cancer Sister   . Hypertension Brother   . Congestive Heart Failure Mother     SOCIAL HISTORY: History   Social History  . Marital Status: Divorced    Spouse Name: N/A  . Number of Children: 0  . Years of Education: N/A   Occupational History  .  [employer] Main Topics  . Smoking status: Current Every Day Smoker -- 2.00 packs/day    Types: Cigarettes  . Smokeless tobacco: Never Used  . Alcohol Use: Yes     Comment: Consumes beer on the weekends only  . Drug Use: No  . Sexual Activity: Not on file   Other Topics Concern  . Not on file   Social History Narrative      PHYSICAL EXAM  Filed Vitals:   01/20/15 1505  BP: 136/85  Pulse: 62  Height: 5\' 6"  (1.676 m)  Weight: 142 lb (64.411 kg)   Body mass index is 22.93 kg/(m^2).  Generalized: Well developed, in no acute distress   Neurological examination  Mentation: Alert oriented to time, place, history taking. Follows all commands speech and language fluent Cranial nerve II-XII: Pupils were equal round reactive to light. Extraocular movements were full,  visual field were full on confrontational test. Facial sensation and strength were normal. Uvula tongue midline. Head turning and shoulder shrug  were normal and symmetric. Motor: The motor testing reveals 5 over 5 strength of all 4 extremities. Good symmetric motor tone is noted throughout.  Sensory: Sensory testing is intact to soft touch on all 4 extremities. No evidence of extinction is noted.  Coordination: Cerebellar testing reveals good finger-nose-finger and heel-to-shin bilaterally.  Gait and station: Gait is normal. Tandem gait is normal. Romberg is negative. No drift is seen.  Reflexes: Deep tendon reflexes are symmetric and normal bilaterally.   DIAGNOSTIC DATA (LABS, IMAGING, TESTING) - I reviewed patient records, labs, notes, testing and imaging myself where available.  ASSESSMENT AND PLAN 55 y.o. year old female  has a past medical history of Arthritis; Chronic back pain; Anxiety; Atypical facial pain; Foot fracture, right; Abnormal brain MRI (07/26/2013); and RSD upper limb (03/07/2014). here with:  1. Atypical facial pain 2. Regional pain syndrome in the right extremity  I will increase the patient's Topamax 25 mg to 2 tablets twice a day. She will continue taking gabapentin 300 mg 3 times a day. She will let us know if this is beneficial for her facial pain. If so we can change her Topamax to 50 mg tablets instead of 25 mg tablets. Patient will continue to follow up with pain specialist in regards to her right extremity pain. Patient advised that if her symptoms worsen or she develops new symptoms she she'll let us know. Otherwise she will follow-up in 6 months or sooner if needed    Ward Givens, MSN, NP-C 01/20/2015, 3:23 PM [facility] [address] [place], [place] [address] [phone]  Note: This document was prepared with digital dictation and possible smart phrase technology. Any transcriptional errors that result from this process  are unintentional.

## 2015-01-20 NOTE — Progress Notes (Signed)
I have read the note, and I agree with the clinical assessment and plan.  Lynne Takemoto KEITH   

## 2015-01-20 NOTE — Patient Instructions (Signed)
Increase Topamax to 2 tablets (50 mg)  Twice a day.  If this is beneficial we can call in the 50 mg tablet for your next refill. If your symptoms worsen or you develop new symptoms please let us know.

## 2015-01-27 ENCOUNTER — Telehealth: Payer: Self-pay | Admitting: Adult Health

## 2015-01-27 MED ORDER — TOPIRAMATE 50 MG PO TABS: 50.0000 mg | ORAL_TABLET | Freq: Two times a day (BID) | ORAL | Status: DC

## 2015-01-27 NOTE — Telephone Encounter (Signed)
Patient called requesting the topiramate (TOPAMAX) 25 MG tablet the 1 in am and 2 in pm is working well. She stated Jinny Blossom had said she would call in 50mg  if all was going well. Patient can be reached at [phone].

## 2015-01-27 NOTE — Telephone Encounter (Signed)
Last OV note says: Increase Topamax to 2 tablets (50 mg) Twice a day.  If this is beneficial we can call in the 50 mg tablet for your next refill Rx has been updated and sent.  I called the patient back to advise.  Got no answer.  Left message.

## 2015-03-24 ENCOUNTER — Other Ambulatory Visit: Payer: Self-pay

## 2015-03-24 ENCOUNTER — Telehealth: Payer: Self-pay | Admitting: Neurology

## 2015-03-24 MED ORDER — TOPIRAMATE 50 MG PO TABS: 50.0000 mg | ORAL_TABLET | Freq: Two times a day (BID) | ORAL | Status: DC

## 2015-03-24 NOTE — Telephone Encounter (Signed)
I called Melanie at [facility] to clarify Topamax dose as well. Rx for Topamax 50 mg twice daily faxed to [facility]. Confirmation received.

## 2015-03-24 NOTE — Telephone Encounter (Signed)
[name] with [facility] is calling and would like clarification for Rx Topiramate 50 mg.  She states patient's says her dosage has changed to 2 tablets morning and night.  Please call.

## 2015-03-24 NOTE — Telephone Encounter (Signed)
Called patient to clarify Topamax dosage. Patient currently taking two 25mg  tabs of Topamax bid. Megan signed Rx for one 50mg  tab bid. Patinet was grateful.

## 2015-04-01 ENCOUNTER — Telehealth (HOSPITAL_COMMUNITY): Payer: Self-pay | Admitting: *Deleted

## 2015-04-07 ENCOUNTER — Other Ambulatory Visit (HOSPITAL_COMMUNITY): Payer: Self-pay | Admitting: Internal Medicine

## 2015-04-07 DIAGNOSIS — R079 Chest pain, unspecified: Secondary | ICD-10-CM

## 2015-04-08 ENCOUNTER — Inpatient Hospital Stay (HOSPITAL_COMMUNITY): Admission: RE | Admit: 2015-04-08 | Payer: Self-pay | Source: Ambulatory Visit

## 2015-04-10 ENCOUNTER — Telehealth (HOSPITAL_COMMUNITY): Payer: Self-pay

## 2015-04-10 NOTE — Telephone Encounter (Signed)
Encounter complete. 

## 2015-04-15 ENCOUNTER — Ambulatory Visit (HOSPITAL_COMMUNITY)
Admission: RE | Admit: 2015-04-15 | Discharge: 2015-04-15 | Disposition: A | Discharge status: Home / Self Care | Payer: 59 | Source: Ambulatory Visit | Attending: Cardiology | Admitting: Cardiology

## 2015-04-15 DIAGNOSIS — M79601 Pain in right arm: Secondary | ICD-10-CM | POA: Diagnosis not present

## 2015-04-15 DIAGNOSIS — R51 Headache: Secondary | ICD-10-CM | POA: Insufficient documentation

## 2015-04-15 DIAGNOSIS — R079 Chest pain, unspecified: Secondary | ICD-10-CM | POA: Diagnosis not present

## 2015-04-15 DIAGNOSIS — F17200 Nicotine dependence, unspecified, uncomplicated: Secondary | ICD-10-CM | POA: Insufficient documentation

## 2015-04-15 DIAGNOSIS — Z8249 Family history of ischemic heart disease and other diseases of the circulatory system: Secondary | ICD-10-CM | POA: Insufficient documentation

## 2015-04-15 LAB — MYOCARDIAL PERFUSION IMAGING
CHL CUP NUCLEAR SRS: 0
CHL CUP NUCLEAR SSS: 3
LV dias vol: 93 mL
LV sys vol: 31 mL
Peak HR: 74 {beats}/min
Rest HR: 51 {beats}/min
SDS: 3
TID: 1.05

## 2015-04-15 MED ORDER — TECHNETIUM TC 99M SESTAMIBI GENERIC - CARDIOLITE
32.1000 | Freq: Once | INTRAVENOUS | Status: AC | PRN
  Administered 2015-04-15: 32.1 via INTRAVENOUS

## 2015-04-15 MED ORDER — TECHNETIUM TC 99M SESTAMIBI GENERIC - CARDIOLITE
10.8000 | Freq: Once | INTRAVENOUS | Status: AC | PRN
  Administered 2015-04-15: 10.8 via INTRAVENOUS

## 2015-04-15 MED ORDER — REGADENOSON 0.4 MG/5ML IV SOLN
0.4000 mg | Freq: Once | INTRAVENOUS | Status: AC
  Administered 2015-04-15: 0.4 mg via INTRAVENOUS

## 2015-07-23 ENCOUNTER — Ambulatory Visit (INDEPENDENT_AMBULATORY_CARE_PROVIDER_SITE_OTHER): Payer: 59 | Admitting: Adult Health

## 2015-07-23 ENCOUNTER — Encounter: Payer: Self-pay | Admitting: Adult Health

## 2015-07-23 VITALS — BP 126/68 | HR 67 | Ht 66.0 in | Wt 142.0 lb

## 2015-07-23 DIAGNOSIS — G501 Atypical facial pain: Secondary | ICD-10-CM | POA: Diagnosis not present

## 2015-07-23 DIAGNOSIS — G90511 Complex regional pain syndrome I of right upper limb: Secondary | ICD-10-CM | POA: Diagnosis not present

## 2015-07-23 MED ORDER — GABAPENTIN 300 MG PO CAPS: 300.0000 mg | ORAL_CAPSULE | Freq: Three times a day (TID) | ORAL | Status: DC

## 2015-07-23 MED ORDER — TOPIRAMATE 50 MG PO TABS: 50.0000 mg | ORAL_TABLET | Freq: Two times a day (BID) | ORAL | Status: DC

## 2015-07-23 NOTE — Progress Notes (Signed)
I have read the note, and I agree with the clinical assessment and plan.  Kizzy Olafson KEITH   

## 2015-07-23 NOTE — Progress Notes (Signed)
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 She returns today for follow-up. The patient's Topamax was increased to 50 mg twice a day. She states that she very rarely has any facial pain. She continues to use the Norco for pain in the right extremity. She states that she will begin uses a spinal stimulator--doing a trial Initially to see if it is beneficial for her pain. Patient states that she has no new neurological symptoms. She returns today for an evaluation.  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 She returns today for follow-up. She continues to take Topamax and gabapentin. She reports that this medication has eliminated her facial pain. She does state that usually by mid afternoon the pain will start to come back. The pain is normally located on the left side of the face in the nasal fold. The patient has retired and still uses Norco for severe pain. She states that she has tried to cut back but tends to have severe pain in the right hand. She states that she is considering using a TENS unit. She denies any new symptoms. She returns today for an evaluation  REVIEW OF SYSTEMS: Out of a complete 14 system review of symptoms, the patient complains only of the following symptoms, and all other reviewed systems are negative.  Diarrhea, daytime sleepiness, fatigue  ALLERGIES: Allergies  Allergen Reactions  . Prednisone     HOME MEDICATIONS: Outpatient Prescriptions Prior to Visit  Medication Sig Dispense Refill  . HYDROcodone-acetaminophen (NORCO) 10-325 MG per tablet Take 4 tablets by mouth daily as needed.    . meloxicam (MOBIC)  7.5 MG tablet Take 7.5 mg by mouth 2 (two) times daily.     . sertraline (ZOLOFT) 50 MG tablet Take 50 mg by mouth daily.   3  . triamcinolone cream (KENALOG) 0.1 % Apply 1 application topically 2 (two) times daily.     Marland Kitchen gabapentin (NEURONTIN) 300 MG capsule TAKE ONE CAPSULE BY MOUTH 3 TIMES A DAY 90 capsule 6  . topiramate (TOPAMAX) 50 MG tablet Take 1 tablet (50 mg total) by mouth 2 (two) times daily. 60 tablet 5   No facility-administered medications prior to visit.    PAST MEDICAL HISTORY: Past Medical History  Diagnosis Date  . Arthritis   . Chronic back pain   . Anxiety   . Atypical facial pain   . Foot fracture, right   . Abnormal brain MRI 07/26/2013  . 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 Abdominal hysterectomy  2012    FAMILY HISTORY: Family History  Problem Relation Age of Onset  . Coronary artery disease Father   . Prostate cancer Sister   . Hypertension Brother   . Congestive Heart Failure Mother     SOCIAL HISTORY: Social History   Social History  . Marital Status: Divorced    Spouse Name: N/A  . Number of Children: 0  . Years of Education: N/A   Occupational History  .  [employer] History Main Topics  . Smoking status: Current Every Day Smoker -- 2.00 packs/day  Types: Cigarettes  . Smokeless tobacco: Never Used  . Alcohol Use: Yes     Comment: Consumes beer on the weekends only  . Drug Use: No  . Sexual Activity: Not on file   Other Topics Concern  . Not on file   Social History Narrative      PHYSICAL EXAM  Filed Vitals:   07/23/15 1258  BP: 126/68  Pulse: 67  Height: 5' 6" (1.676 m)  Weight: 142 lb (64.411 kg)   Body mass index is 22.93 kg/(m^2).  Generalized: Well developed, in no acute distress   Neurological examination  Mentation: Alert oriented to time, place, history taking. Follows all commands speech and language fluent Cranial  nerve II-XII: Pupils were equal round reactive to light. Extraocular movements were full, visual field were full on confrontational test. Facial sensation and strength were normal. Uvula tongue midline. Head turning and shoulder shrug  were normal and symmetric. Motor: The motor testing reveals 5 over 5 strength of all 4 extremities. Good symmetric motor tone is noted throughout.  Sensory: Sensory testing is intact to soft touch on all 4 extremities. No evidence of extinction is noted.  Coordination: Cerebellar testing reveals good finger-nose-finger and heel-to-shin bilaterally.  Gait and station: Gait is normal. Tandem gait is normal. Romberg is negative. No drift is seen.  Reflexes: Deep tendon reflexes are symmetric and normal bilaterally.   DIAGNOSTIC DATA (LABS, IMAGING, TESTING) - I reviewed patient records, labs, notes, testing and imaging myself where available.  Lab Results  Component Value Date   WBC 10.2 08/20/2010   HGB 15.7* 08/20/2010   HCT 45.7 08/20/2010   MCV 94.4 08/20/2010   PLT 195 08/20/2010      Component Value Date/Time   NA 140 08/20/2010 0903   K 4.4 08/20/2010 0903   CL 106 08/20/2010 0903   CO2 23 08/20/2010 0903   GLUCOSE 120* 08/20/2010 0903   BUN 12 08/20/2010 0903   CREATININE 0.80 08/20/2010 0903   CALCIUM 9.6 08/20/2010 0903   ALBUMIN 4.7 01/19/2013 1318   GFRNONAA >60 08/20/2010 0903   GFRAA  08/20/2010 0903    >60        The eGFR has been calculated using the MDRD equation. This calculation has not been validated in all clinical situations. eGFR's persistently <60 mL/min signify possible Chronic Kidney Disease.      ASSESSMENT AND PLAN 55 y.o. year old female  has a past medical history of Arthritis; Chronic back pain; Anxiety; Atypical facial pain; Foot fracture, right; Abnormal brain MRI (07/26/2013); and RSD upper limb (03/07/2014). here with:  1. Atypical facial pain 2. Regional pain syndrome right upper extremity  Overall the  patient is doing well. She will continue on Topamax 50 mg twice a day and gabapentin 300 mg 3 times a day. Patient advised that if her facial pain returns or worsens she should let us know. She will follow-up in 6 months with Dr. Jannifer Franklin.   Ward Givens, MSN, NP-C 07/23/2015, 1:31 PM [facility] [address], [place], [place] [address] [phone]

## 2015-07-23 NOTE — Patient Instructions (Signed)
Continue Topamax If your symptoms worsen or you develop new symptoms please let us know.   

## 2015-09-16 ENCOUNTER — Telehealth: Payer: Self-pay

## 2015-09-16 MED ORDER — TOPIRAMATE 50 MG PO TABS: 50.0000 mg | ORAL_TABLET | Freq: Two times a day (BID) | ORAL | Status: DC

## 2015-09-16 NOTE — Telephone Encounter (Signed)
Request sent to [place] to re-send Rx

## 2016-01-20 ENCOUNTER — Encounter: Payer: Self-pay | Admitting: Neurology

## 2016-01-20 ENCOUNTER — Ambulatory Visit (INDEPENDENT_AMBULATORY_CARE_PROVIDER_SITE_OTHER): Payer: 59 | Admitting: Neurology

## 2016-01-20 VITALS — BP 149/76 | HR 62 | Ht 65.0 in | Wt 138.0 lb

## 2016-01-20 DIAGNOSIS — G501 Atypical facial pain: Secondary | ICD-10-CM | POA: Diagnosis not present

## 2016-01-20 DIAGNOSIS — G90511 Complex regional pain syndrome I of right upper limb: Secondary | ICD-10-CM

## 2016-01-20 MED ORDER — GABAPENTIN 300 MG PO CAPS: 300.0000 mg | ORAL_CAPSULE | Freq: Three times a day (TID) | ORAL | 6 refills | Status: DC

## 2016-01-20 NOTE — Progress Notes (Signed)
Reason for visit: Atypical facial pain  [name] is an 55 y.o. female  History of present illness:  [name] is a 55 year old right-handed white female with a history of atypical facial pain involving the left maxillary area. The patient has had fairly good improvement of the pain with a combination of gabapentin and Topamax. The patient also has right hand discomfort that is felt to be related to a complex regional pain syndrome. The patient is followed through a pain center, she recently had a trial with a spinal stimulator that was not effective. She has been placed on Lyrica 50 mg daily with good improvement of the hand pain. The patient remains on gabapentin. The patient denies any other new medical issues that have come up since last seen. She returns for an evaluation.  Past Medical History:  Diagnosis Date  . Abnormal brain MRI 07/26/2013  . Anxiety   . Arthritis   . Atypical facial pain   . Chronic back pain   . Foot fracture, right   . 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 ABDOMINAL HYSTERECTOMY  2012    Family History  Problem Relation Age of Onset  . Coronary artery disease Father   . Prostate cancer Sister   . Hypertension Brother   . Congestive Heart Failure Mother     Social history:  reports that she has been smoking Cigarettes.  She has been smoking about 2.00 packs per day. She has never used smokeless tobacco. She reports that she drinks alcohol. She reports that she does not use drugs.    Allergies  Allergen Reactions  . Prednisone     Medications:  Prior to Admission medications   Medication Sig Start Date End Date Taking? Authorizing Provider  AMRIX 15 MG 24 hr capsule  12/25/15  Yes Historical Provider, MD  gabapentin (NEURONTIN) 300 MG capsule Take 1 capsule (300 mg total) by mouth 3 (three) times daily. 01/20/16  Yes Kathrynn Ducking, MD  HYDROcodone-acetaminophen [facility]) 10-325 MG per tablet Take  4 tablets by mouth daily as needed. 02/21/14  Yes Historical Provider, MD  LYRICA 50 MG capsule 50 mg daily.  01/03/16  Yes Historical Provider, MD  meloxicam (MOBIC) 7.5 MG tablet Take 7.5 mg by mouth 2 (two) times daily.  11/15/12  Yes Historical Provider, MD  sertraline (ZOLOFT) 50 MG tablet Take 50 mg by mouth daily.  12/25/14  Yes Historical Provider, MD  topiramate (TOPAMAX) 50 MG tablet Take 1 tablet (50 mg total) by mouth 2 (two) times daily. 09/16/15  Yes Kathrynn Ducking, MD  triamcinolone cream (KENALOG) 0.1 % Apply 1 application topically 2 (two) times daily.  11/15/12  Yes Historical Provider, MD    ROS:  Out of a complete 14 system review of symptoms, the patient complains only of the following symptoms, and all other reviewed systems are negative.  Fatigue Joint pain, joint swelling  Blood pressure (!) 149/76, pulse 62, height 5\' 5"  (1.651 m), weight 138 lb (62.6 kg).  Physical Exam  General: The patient is alert and cooperative at the time of the examination.  Skin: No significant peripheral edema is noted.   Neurologic Exam  Mental status: The patient is alert and oriented x 3 at the time of the examination. The patient has apparent normal recent and remote memory, with an apparently normal attention span and concentration ability.   Cranial nerves: Facial symmetry is present.  Speech is normal, no aphasia or dysarthria is noted. Extraocular movements are full. Visual fields are full.  Motor: The patient has good strength in all 4 extremities.  Sensory examination: Soft touch sensation is symmetric on the face, arms, and legs, with exception of some decrease in soft touch sensation on the right hand.  Coordination: The patient has good finger-nose-finger and heel-to-shin bilaterally.  Gait and station: The patient has a normal gait. Tandem gait is normal. Romberg is negative. No drift is seen.  Reflexes: Deep tendon reflexes are symmetric.   Assessment/Plan:  1.  Atypical facial pain, left maxillary  2. Right hand pain, complex regional pain syndrome  The patient is doing better with the facial pain and she has gained improvement with Lyrica with the right hand pain. The patient is on low doses of gabapentin and Lyrica. Higher doses of gabapentin have resulted in drowsiness. The patient will follow-up through this office in 6 months, we will continue the Topamax and the gabapentin, a prescription for the gabapentin was called in.  Jill Alexanders MD 01/20/2016 11:19 AM  [facility] [address] [place], [place] [address]  Phone [phone] Fax [phone]

## 2016-06-25 ENCOUNTER — Encounter: Payer: Self-pay | Admitting: Adult Health

## 2016-06-30 DIAGNOSIS — Z0000 Encounter for general adult medical examination without abnormal findings: Secondary | ICD-10-CM | POA: Diagnosis not present

## 2016-06-30 DIAGNOSIS — R7302 Impaired glucose tolerance (oral): Secondary | ICD-10-CM | POA: Diagnosis not present

## 2016-06-30 DIAGNOSIS — G90519 Complex regional pain syndrome I of unspecified upper limb: Secondary | ICD-10-CM | POA: Diagnosis not present

## 2016-06-30 DIAGNOSIS — G5600 Carpal tunnel syndrome, unspecified upper limb: Secondary | ICD-10-CM | POA: Diagnosis not present

## 2016-07-14 DIAGNOSIS — Z79899 Other long term (current) drug therapy: Secondary | ICD-10-CM | POA: Diagnosis not present

## 2016-07-14 DIAGNOSIS — M79641 Pain in right hand: Secondary | ICD-10-CM | POA: Diagnosis not present

## 2016-07-14 DIAGNOSIS — Z79891 Long term (current) use of opiate analgesic: Secondary | ICD-10-CM | POA: Diagnosis not present

## 2016-07-14 DIAGNOSIS — G90519 Complex regional pain syndrome I of unspecified upper limb: Secondary | ICD-10-CM | POA: Diagnosis not present

## 2016-07-14 DIAGNOSIS — G894 Chronic pain syndrome: Secondary | ICD-10-CM | POA: Diagnosis not present

## 2016-07-16 DIAGNOSIS — J302 Other seasonal allergic rhinitis: Secondary | ICD-10-CM | POA: Diagnosis not present

## 2016-07-16 DIAGNOSIS — G90511 Complex regional pain syndrome I of right upper limb: Secondary | ICD-10-CM | POA: Diagnosis not present

## 2016-07-16 DIAGNOSIS — Z1389 Encounter for screening for other disorder: Secondary | ICD-10-CM | POA: Diagnosis not present

## 2016-07-16 DIAGNOSIS — Z0000 Encounter for general adult medical examination without abnormal findings: Secondary | ICD-10-CM | POA: Diagnosis not present

## 2016-07-22 ENCOUNTER — Ambulatory Visit: Payer: 59 | Admitting: Adult Health

## 2016-07-27 DIAGNOSIS — M79641 Pain in right hand: Secondary | ICD-10-CM | POA: Diagnosis not present

## 2016-07-27 DIAGNOSIS — G894 Chronic pain syndrome: Secondary | ICD-10-CM | POA: Diagnosis not present

## 2016-07-27 DIAGNOSIS — G5600 Carpal tunnel syndrome, unspecified upper limb: Secondary | ICD-10-CM | POA: Diagnosis not present

## 2016-08-03 ENCOUNTER — Encounter: Payer: Self-pay | Admitting: Adult Health

## 2016-08-03 ENCOUNTER — Ambulatory Visit (INDEPENDENT_AMBULATORY_CARE_PROVIDER_SITE_OTHER): Payer: 59 | Admitting: Adult Health

## 2016-08-03 VITALS — BP 144/83 | HR 59 | Ht 65.0 in | Wt 135.8 lb

## 2016-08-03 DIAGNOSIS — G90511 Complex regional pain syndrome I of right upper limb: Secondary | ICD-10-CM | POA: Diagnosis not present

## 2016-08-03 DIAGNOSIS — G501 Atypical facial pain: Secondary | ICD-10-CM | POA: Diagnosis not present

## 2016-08-03 MED ORDER — TOPIRAMATE 50 MG PO TABS: 75.0000 mg | ORAL_TABLET | Freq: Two times a day (BID) | ORAL | 11 refills | Status: DC

## 2016-08-03 NOTE — Progress Notes (Signed)
I have read the note, and I agree with the clinical assessment and plan.  [name],[name]   

## 2016-08-03 NOTE — Patient Instructions (Signed)
Increase Topamax 75 mg twice a day Can take 600 mg of gabapentin at lunch and dinner if needed If your symptoms worsen or you develop new symptoms please let us know.

## 2016-08-03 NOTE — Progress Notes (Signed)
PATIENT: [name] DOB: [birth year]  REASON FOR VISIT: follow up- atypical facial pain HISTORY FROM: patient  HISTORY OF PRESENT ILLNESS: [name] is a 56 year old female with a history of atypical facial pain involving the left maxilla area. She returns today for follow-up. She continues on Topamax 50 mg twice a day as well as gabapentin 300 mg 3 times a day. She states  recently she is having more pain in in the left side of the face. She states that several times a week she's had to take an additional gabapentin at lunch and dinner. She states that it is beneficial however it does make her sleepy. She states that she is no longer taking Lyrica since it was similar to gabapentin and very expensive. She reports that she has an appointment with her dentist today. She wants to ensure that she has no dental issues that is causing or contributing to the pain. She returns today for an evaluation.  HISTORY 01/20/16 (willis): [name] is a 56 year old right-handed white female with a history of atypical facial pain involving the left maxillary area. The patient has had fairly good improvement of the pain with a combination of gabapentin and Topamax. The patient also has right hand discomfort that is felt to be related to a complex regional pain syndrome. The patient is followed through a pain center, she recently had a trial with a spinal stimulator that was not effective. She has been placed on Lyrica 50 mg daily with good improvement of the hand pain. The patient remains on gabapentin. The patient denies any other new medical issues that have come up since last seen. She returns for an evaluation.   REVIEW OF SYSTEMS: Out of a complete 14 system review of symptoms, the patient complains only of the following symptoms, and all other reviewed systems are negative.  Back pain, ringing in ears, fatigue  ALLERGIES: Allergies  Allergen Reactions  . Prednisone     HOME MEDICATIONS: Outpatient  Medications Prior to Visit  Medication Sig Dispense Refill  . gabapentin (NEURONTIN) 300 MG capsule Take 1 capsule (300 mg total) by mouth 3 (three) times daily. 90 capsule 6  . HYDROcodone-acetaminophen (NORCO) 10-325 MG per tablet Take 4 tablets by mouth daily as needed.    . meloxicam (MOBIC) 7.5 MG tablet Take 7.5 mg by mouth 2 (two) times daily.     . sertraline (ZOLOFT) 50 MG tablet Take 50 mg by mouth daily.   3  . topiramate (TOPAMAX) 50 MG tablet Take 1 tablet (50 mg total) by mouth 2 (two) times daily. 60 tablet 11  . triamcinolone cream (KENALOG) 0.1 % Apply 1 application topically 2 (two) times daily.     . AMRIX 15 MG 24 hr capsule Take 15 mg by mouth daily as needed.     Marland Kitchen LYRICA 50 MG capsule 50 mg daily.      No facility-administered medications prior to visit.     PAST MEDICAL HISTORY: Past Medical History:  Diagnosis Date  . Abnormal brain MRI 07/26/2013  . Anxiety   . Arthritis   . Atypical facial pain   . Chronic back pain   . Foot fracture, right   . 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 ABDOMINAL HYSTERECTOMY  2012    FAMILY HISTORY: Family History  Problem Relation Age of Onset  . Coronary artery disease Father   . Prostate cancer Sister   .  Hypertension Brother   . Congestive Heart Failure Mother     SOCIAL HISTORY: Social History   Social History  . Marital status: Divorced    Spouse name: N/A  . Number of children: 0  . Years of education: N/A   Occupational History  .  [employer] History Main Topics  . Smoking status: Current Every Day Smoker    Packs/day: 2.00    Types: Cigarettes  . Smokeless tobacco: Never Used  . Alcohol use Yes     Comment: Consumes beer on the weekends only  . Drug use: No  . Sexual activity: Not on file   Other Topics Concern  . Not on file   Social History Narrative  . No narrative on file      PHYSICAL  EXAM  Vitals:   08/03/16 1124  BP: (!) 144/83  Pulse: (!) 59  Weight: 135 lb 12.8 oz (61.6 kg)  Height: 5\' 5"  (1.651 m)   Body mass index is 22.6 kg/m.  Generalized: Well developed, in no acute distress   Neurological examination  Mentation: Alert oriented to time, place, history taking. Follows all commands speech and language fluent Cranial nerve II-XII: Pupils were equal round reactive to light. Extraocular movements were full, visual field were full on confrontational test. Facial sensation and strength were normal. Uvula tongue midline. Head turning and shoulder shrug  were normal and symmetric. Motor: The motor testing reveals 5 over 5 strength of all 4 extremities. Good symmetric motor tone is noted throughout.  Sensory: Sensory testing is intact to soft touch on all 4 extremities. No evidence of extinction is noted.  Coordination: Cerebellar testing reveals good finger-nose-finger and heel-to-shin bilaterally.  Gait and station: Gait is normal.  Reflexes: Deep tendon reflexes are symmetric and normal bilaterally.   DIAGNOSTIC DATA (LABS, IMAGING, TESTING) - I reviewed patient records, labs, notes, testing and imaging myself where available.       ASSESSMENT AND PLAN 55 y.o. year old female  has a past medical history of Abnormal brain MRI (07/26/2013); Anxiety; Arthritis; Atypical facial pain; Chronic back pain; Foot fracture, right; and RSD upper limb (03/07/2014). here with:  1. Atypical facial pain 2. Regional pain syndrome right upper extremity   The patient is experiencing more facial pain. We will increase Topamax to 75 mg twice a day. She is advised that she can take 600 mg of gabapentin at lunch and dinner if needed. Patient advised that if her pain does not improve she should let us know. She will follow-up in 6 months or sooner if needed.    Ward Givens, MSN, NP-C 08/03/2016, 11:37 AM [facility] [address] [place], [place]  [address] [phone]

## 2016-08-11 DIAGNOSIS — M79641 Pain in right hand: Secondary | ICD-10-CM | POA: Diagnosis not present

## 2016-08-11 DIAGNOSIS — G90519 Complex regional pain syndrome I of unspecified upper limb: Secondary | ICD-10-CM | POA: Diagnosis not present

## 2016-08-11 DIAGNOSIS — Z79891 Long term (current) use of opiate analgesic: Secondary | ICD-10-CM | POA: Diagnosis not present

## 2016-08-11 DIAGNOSIS — Z79899 Other long term (current) drug therapy: Secondary | ICD-10-CM | POA: Diagnosis not present

## 2016-08-11 DIAGNOSIS — G894 Chronic pain syndrome: Secondary | ICD-10-CM | POA: Diagnosis not present

## 2016-09-08 DIAGNOSIS — M79641 Pain in right hand: Secondary | ICD-10-CM | POA: Diagnosis not present

## 2016-09-08 DIAGNOSIS — G894 Chronic pain syndrome: Secondary | ICD-10-CM | POA: Diagnosis not present

## 2016-09-08 DIAGNOSIS — Z79891 Long term (current) use of opiate analgesic: Secondary | ICD-10-CM | POA: Diagnosis not present

## 2016-09-08 DIAGNOSIS — G90519 Complex regional pain syndrome I of unspecified upper limb: Secondary | ICD-10-CM | POA: Diagnosis not present

## 2016-09-08 DIAGNOSIS — Z79899 Other long term (current) drug therapy: Secondary | ICD-10-CM | POA: Diagnosis not present

## 2016-10-05 DIAGNOSIS — Z79891 Long term (current) use of opiate analgesic: Secondary | ICD-10-CM | POA: Diagnosis not present

## 2016-10-05 DIAGNOSIS — Z79899 Other long term (current) drug therapy: Secondary | ICD-10-CM | POA: Diagnosis not present

## 2016-10-05 DIAGNOSIS — G894 Chronic pain syndrome: Secondary | ICD-10-CM | POA: Diagnosis not present

## 2016-10-05 DIAGNOSIS — M792 Neuralgia and neuritis, unspecified: Secondary | ICD-10-CM | POA: Diagnosis not present

## 2016-10-06 DIAGNOSIS — M79641 Pain in right hand: Secondary | ICD-10-CM | POA: Diagnosis not present

## 2016-10-06 DIAGNOSIS — Z79899 Other long term (current) drug therapy: Secondary | ICD-10-CM | POA: Diagnosis not present

## 2016-10-06 DIAGNOSIS — Z79891 Long term (current) use of opiate analgesic: Secondary | ICD-10-CM | POA: Diagnosis not present

## 2016-10-06 DIAGNOSIS — G90519 Complex regional pain syndrome I of unspecified upper limb: Secondary | ICD-10-CM | POA: Diagnosis not present

## 2016-10-06 DIAGNOSIS — G894 Chronic pain syndrome: Secondary | ICD-10-CM | POA: Diagnosis not present

## 2016-10-25 IMAGING — NM NM MISC PROCEDURE
6 series · 36 of 36 positions shown · non-contrast
Comparison: none

[Series 1: wbr rest · 6.40mm/px · 6 of 64 frames shown]
[frame 6/64]
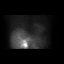
[frame 16/64]
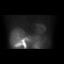
[frame 27/64]
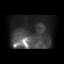
[frame 38/64]
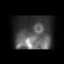
[frame 48/64]
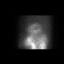
[frame 59/64]
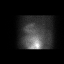

[Series 1: wbr_r-proj_st wbr rest · 6.40mm/px · 6 of 64 frames shown]
[frame 6/64]
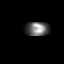
[frame 16/64]
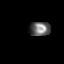
[frame 27/64]
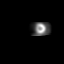
[frame 38/64]
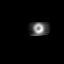
[frame 48/64]
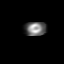
[frame 59/64]
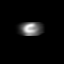

[Series 2: wbr stress-gsp · 6.40mm/px · 6 of 512 frames shown]
[frame 43/512]
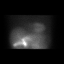
[frame 128/512]
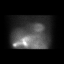
[frame 214/512]
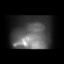
[frame 299/512]
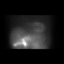
[frame 384/512]
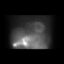
[frame 470/512]
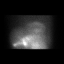

[Series 2: wbr_s-proj_st wbr stress-gsp · 6.40mm/px · 6 of 512 frames shown]
[frame 43/512]
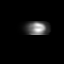
[frame 128/512]
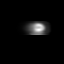
[frame 214/512]
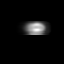
[frame 299/512]
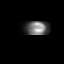
[frame 384/512]
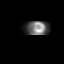
[frame 470/512]
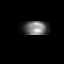

[Series 3: wbr_s-proj_st wbr stress-sum-em · 6.40mm/px · 6 of 64 frames shown]
[frame 6/64]
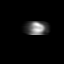
[frame 16/64]
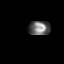
[frame 27/64]
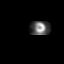
[frame 38/64]
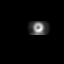
[frame 48/64]
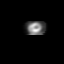
[frame 59/64]
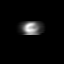

[Series 3: wbr stress-sum-em · 6.40mm/px · 6 of 64 frames shown]
[frame 6/64]
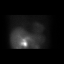
[frame 16/64]
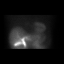
[frame 27/64]
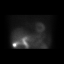
[frame 38/64]
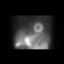
[frame 48/64]
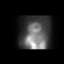
[frame 59/64]
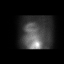

[36 of 36 positions shown; findings below may reference images not displayed]

Canned report from images found in remote index.

Refer to host system for actual result text.

## 2016-11-03 DIAGNOSIS — G894 Chronic pain syndrome: Secondary | ICD-10-CM | POA: Diagnosis not present

## 2016-11-03 DIAGNOSIS — G90519 Complex regional pain syndrome I of unspecified upper limb: Secondary | ICD-10-CM | POA: Diagnosis not present

## 2016-11-03 DIAGNOSIS — Z79891 Long term (current) use of opiate analgesic: Secondary | ICD-10-CM | POA: Diagnosis not present

## 2016-11-03 DIAGNOSIS — Z79899 Other long term (current) drug therapy: Secondary | ICD-10-CM | POA: Diagnosis not present

## 2016-11-03 DIAGNOSIS — M79641 Pain in right hand: Secondary | ICD-10-CM | POA: Diagnosis not present

## 2016-12-01 DIAGNOSIS — G894 Chronic pain syndrome: Secondary | ICD-10-CM | POA: Diagnosis not present

## 2016-12-01 DIAGNOSIS — Z79891 Long term (current) use of opiate analgesic: Secondary | ICD-10-CM | POA: Diagnosis not present

## 2016-12-01 DIAGNOSIS — Z79899 Other long term (current) drug therapy: Secondary | ICD-10-CM | POA: Diagnosis not present

## 2016-12-01 DIAGNOSIS — M79641 Pain in right hand: Secondary | ICD-10-CM | POA: Diagnosis not present

## 2016-12-01 DIAGNOSIS — G90519 Complex regional pain syndrome I of unspecified upper limb: Secondary | ICD-10-CM | POA: Diagnosis not present

## 2016-12-27 ENCOUNTER — Other Ambulatory Visit: Payer: Self-pay | Admitting: Neurology

## 2016-12-30 DIAGNOSIS — M79641 Pain in right hand: Secondary | ICD-10-CM | POA: Diagnosis not present

## 2016-12-30 DIAGNOSIS — G90519 Complex regional pain syndrome I of unspecified upper limb: Secondary | ICD-10-CM | POA: Diagnosis not present

## 2016-12-30 DIAGNOSIS — Z79891 Long term (current) use of opiate analgesic: Secondary | ICD-10-CM | POA: Diagnosis not present

## 2016-12-30 DIAGNOSIS — Z79899 Other long term (current) drug therapy: Secondary | ICD-10-CM | POA: Diagnosis not present

## 2016-12-30 DIAGNOSIS — G894 Chronic pain syndrome: Secondary | ICD-10-CM | POA: Diagnosis not present

## 2017-01-25 ENCOUNTER — Other Ambulatory Visit: Payer: Self-pay | Admitting: Physician Assistant

## 2017-01-25 ENCOUNTER — Ambulatory Visit
Admission: RE | Admit: 2017-01-25 | Discharge: 2017-01-25 | Disposition: A | Discharge status: Home / Self Care | Payer: 59 | Source: Ambulatory Visit | Attending: Physician Assistant | Admitting: Physician Assistant

## 2017-01-25 DIAGNOSIS — G90519 Complex regional pain syndrome I of unspecified upper limb: Secondary | ICD-10-CM | POA: Diagnosis not present

## 2017-01-25 DIAGNOSIS — Z79891 Long term (current) use of opiate analgesic: Secondary | ICD-10-CM | POA: Diagnosis not present

## 2017-01-25 DIAGNOSIS — M25511 Pain in right shoulder: Secondary | ICD-10-CM

## 2017-01-25 DIAGNOSIS — Z79899 Other long term (current) drug therapy: Secondary | ICD-10-CM | POA: Diagnosis not present

## 2017-01-25 DIAGNOSIS — G894 Chronic pain syndrome: Secondary | ICD-10-CM | POA: Diagnosis not present

## 2017-02-01 ENCOUNTER — Ambulatory Visit (INDEPENDENT_AMBULATORY_CARE_PROVIDER_SITE_OTHER): Payer: 59 | Admitting: Adult Health

## 2017-02-01 ENCOUNTER — Encounter: Payer: Self-pay | Admitting: Adult Health

## 2017-02-01 VITALS — BP 148/74 | HR 63 | Ht 65.0 in | Wt 140.4 lb

## 2017-02-01 DIAGNOSIS — G501 Atypical facial pain: Secondary | ICD-10-CM

## 2017-02-01 NOTE — Patient Instructions (Signed)
Your Plan:  Continue Topamax and gabapentin If your symptoms worsen or you develop new symptoms please let us know.   Thank you for coming to see [place] at [facility]. I hope we have been able to provide you high quality care today.  You may receive a patient satisfaction survey over the next few weeks. We would appreciate your feedback and comments so that we may continue to improve ourselves and the health of our patients.

## 2017-02-01 NOTE — Progress Notes (Signed)
I have read the note, and I agree with the clinical assessment and plan.  WILLIS,CHARLES KEITH   

## 2017-02-01 NOTE — Progress Notes (Signed)
PATIENT: [name] DOB: 1961-02-24  REASON FOR VISIT: follow up HISTORY FROM: patient  HISTORY OF PRESENT ILLNESS: Today 02/01/17 [name] is a 56 year old female with a history of atypical facial pain. She returns today for follow-up. At the last visit we'll increase Topamax to 75 mg twice a day. She reports that this has been beneficial for her discomfort. She continues to take gabapentin 300 mg 3 times a day. She reports that she has noticed when it is raining or a storm is coming through her pain tends to worsen. During this time she typically will take an extra gabapentin. She reports this is beneficial. She denies any new neurological symptoms. She returns today for an evaluation.   HISTORY 2/13/18Ms. [name] is a 56 year old female with a history of atypical facial pain involving the left maxilla area. She returns today for follow-up. She continues on Topamax 50 mg twice a day as well as gabapentin 300 mg 3 times a day. She states  recently she is having more pain in in the left side of the face. She states that several times a week she's had to take an additional gabapentin at lunch and dinner. She states that it is beneficial however it does make her sleepy. She states that she is no longer taking Lyrica since it was similar to gabapentin and very expensive. She reports that she has an appointment with her dentist today. She wants to ensure that she has no dental issues that is causing or contributing to the pain. She returns today for an evaluation.  HISTORY 01/20/16 (willis): [name] is a 56 year old right-handed white female with a history of atypical facial pain involving the left maxillary area. The patient has had fairly good improvement of the pain with a combination of gabapentin and Topamax. The patient also has right hand discomfort that is felt to be related to a complex regional pain syndrome. The patient is followed through a pain center, she recently had a trial with a  spinal stimulator that was not effective. She has been placed on Lyrica 50 mg daily with good improvement of the hand pain. The patient remains on gabapentin. The patient denies any other new medical issues that have come up since last seen. She returns for an evaluation.   REVIEW OF SYSTEMS: Out of a complete 14 system review of symptoms, the patient complains only of the following symptoms, and all other reviewed systems are negative.  Joint pain, fatigue  ALLERGIES: Allergies  Allergen Reactions  . Prednisone     HOME MEDICATIONS: Outpatient Medications Prior to Visit  Medication Sig Dispense Refill  . gabapentin (NEURONTIN) 300 MG capsule TAKE 1 CAPSULE (300 MG TOTAL) BY MOUTH 3 (THREE) TIMES DAILY. 90 capsule 6  . HYDROcodone-acetaminophen (NORCO) 10-325 MG per tablet Take 2 tablets by mouth daily as needed.     Marland Kitchen LYRICA 50 MG capsule 50 mg daily.     . sertraline (ZOLOFT) 50 MG tablet Take 50 mg by mouth daily.   3  . topiramate (TOPAMAX) 50 MG tablet Take 1.5 tablets (75 mg total) by mouth 2 (two) times daily. 90 tablet 11  . triamcinolone cream (KENALOG) 0.1 % Apply 1 application topically 2 (two) times daily.     . AMRIX 15 MG 24 hr capsule Take 15 mg by mouth daily as needed.     . meloxicam (MOBIC) 7.5 MG tablet Take 7.5 mg by mouth 2 (two) times daily.      No  facility-administered medications prior to visit.     PAST MEDICAL HISTORY: Past Medical History:  Diagnosis Date  . Abnormal brain MRI 07/26/2013  . Anxiety   . Arthritis   . Atypical facial pain   . Chronic back pain   . Foot fracture, right   . 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 ABDOMINAL HYSTERECTOMY  2012    FAMILY HISTORY: Family History  Problem Relation Age of Onset  . Coronary artery disease Father   . Prostate cancer Sister   . Hypertension Brother   . Congestive Heart Failure Mother     SOCIAL HISTORY: Social  History   Social History  . Marital status: Divorced    Spouse name: N/A  . Number of children: 0  . Years of education: N/A   Occupational History  .  [employer] History Main Topics  . Smoking status: Current Every Day Smoker    Packs/day: 2.00    Types: Cigarettes  . Smokeless tobacco: Never Used  . Alcohol use Yes     Comment: Consumes beer on the weekends only  . Drug use: No  . Sexual activity: Not on file   Other Topics Concern  . Not on file   Social History Narrative  . No narrative on file      PHYSICAL EXAM  Vitals:   02/01/17 1240  BP: (!) 148/74  Pulse: 63  Weight: 140 lb 6.4 oz (63.7 kg)  Height: _0  (1.651 m)   Body mass index is 23.36 kg/m.  Generalized: Well developed, in no acute distress   Neurological examination  Mentation: Alert oriented to time, place, history taking. Follows all commands speech and language fluent Cranial nerve II-XII: Pupils were equal round reactive to light. Extraocular movements were full, visual field were full on confrontational test. Facial sensation and strength were normal. Uvula tongue midline. Head turning and shoulder shrug  were normal and symmetric. Motor: The motor testing reveals 5 over 5 strength of all 4 extremities. Good symmetric motor tone is noted throughout.  Sensory: Sensory testing is intact to soft touch on all 4 extremities. No evidence of extinction is noted.  Coordination: Cerebellar testing reveals good finger-nose-finger and heel-to-shin bilaterally.  Gait and station: Gait is normal. Tandem gait is normal. Romberg is negative. No drift is seen.  Reflexes: Deep tendon reflexes are symmetric and normal bilaterally.   DIAGNOSTIC DATA (LABS, IMAGING, TESTING) - I reviewed patient records, labs, notes, testing and imaging myself where available.  Lab Results  Component Value Date   WBC 10.2 08/20/2010   HGB 15.7 (H) 08/20/2010   HCT 45.7 08/20/2010   MCV 94.4 08/20/2010    PLT 195 08/20/2010      Component Value Date/Time   NA 140 08/20/2010 0903   K 4.4 08/20/2010 0903   CL 106 08/20/2010 0903   CO2 23 08/20/2010 0903   GLUCOSE 120 (H) 08/20/2010 0903   BUN 12 08/20/2010 0903   CREATININE 0.80 08/20/2010 0903   CALCIUM 9.6 08/20/2010 0903   ALBUMIN 4.7 01/19/2013 1318   GFRNONAA >60 08/20/2010 0903   GFRAA  08/20/2010 0903    >60        The eGFR has been calculated using the MDRD equation. This calculation has not been validated in all clinical situations. eGFR's persistently <60 mL/min signify possible Chronic Kidney Disease.     ASSESSMENT AND PLAN 56 y.o.  year old female  has a past medical history of Abnormal brain MRI (07/26/2013); Anxiety; Arthritis; Atypical facial pain; Chronic back pain; Foot fracture, right; and RSD upper limb (03/07/2014). here with:  1. Atypical facial pain  Overall the patient is doing well. She will continue Topamax 75 mg twice a day as well as gabapentin 300 mg 3 times a day. She is advised that if her symptoms worsen or she develops new symptoms she should let us know. She will follow-up in one year or sooner if needed.  I spent 15 minutes with the patient. 50% of this time was spentdiscussing and reviewing her medication.    Ward Givens, MSN, NP-C 02/01/2017, 1:01 PM [facility] [address], [place], [place] [address] [phone]

## 2017-02-22 DIAGNOSIS — Z79891 Long term (current) use of opiate analgesic: Secondary | ICD-10-CM | POA: Diagnosis not present

## 2017-02-22 DIAGNOSIS — Z79899 Other long term (current) drug therapy: Secondary | ICD-10-CM | POA: Diagnosis not present

## 2017-02-22 DIAGNOSIS — M25511 Pain in right shoulder: Secondary | ICD-10-CM | POA: Diagnosis not present

## 2017-02-22 DIAGNOSIS — G894 Chronic pain syndrome: Secondary | ICD-10-CM | POA: Diagnosis not present

## 2017-02-22 DIAGNOSIS — G90519 Complex regional pain syndrome I of unspecified upper limb: Secondary | ICD-10-CM | POA: Diagnosis not present

## 2017-02-24 ENCOUNTER — Other Ambulatory Visit: Payer: Self-pay | Admitting: Pain Medicine

## 2017-02-24 DIAGNOSIS — M25511 Pain in right shoulder: Secondary | ICD-10-CM

## 2017-03-05 ENCOUNTER — Other Ambulatory Visit: Payer: 59

## 2017-03-11 ENCOUNTER — Ambulatory Visit
Admission: RE | Admit: 2017-03-11 | Discharge: 2017-03-11 | Disposition: A | Discharge status: Home / Self Care | Payer: 59 | Source: Ambulatory Visit | Attending: Pain Medicine | Admitting: Pain Medicine

## 2017-03-11 DIAGNOSIS — M25511 Pain in right shoulder: Secondary | ICD-10-CM

## 2017-03-11 DIAGNOSIS — S46011A Strain of muscle(s) and tendon(s) of the rotator cuff of right shoulder, initial encounter: Secondary | ICD-10-CM | POA: Diagnosis not present

## 2017-03-22 DIAGNOSIS — G894 Chronic pain syndrome: Secondary | ICD-10-CM | POA: Diagnosis not present

## 2017-03-22 DIAGNOSIS — G90519 Complex regional pain syndrome I of unspecified upper limb: Secondary | ICD-10-CM | POA: Diagnosis not present

## 2017-03-22 DIAGNOSIS — M25511 Pain in right shoulder: Secondary | ICD-10-CM | POA: Diagnosis not present

## 2017-03-23 DIAGNOSIS — M7501 Adhesive capsulitis of right shoulder: Secondary | ICD-10-CM | POA: Diagnosis not present

## 2017-03-23 DIAGNOSIS — M67911 Unspecified disorder of synovium and tendon, right shoulder: Secondary | ICD-10-CM | POA: Diagnosis not present

## 2017-03-24 DIAGNOSIS — M65811 Other synovitis and tenosynovitis, right shoulder: Secondary | ICD-10-CM | POA: Diagnosis not present

## 2017-03-24 DIAGNOSIS — M25511 Pain in right shoulder: Secondary | ICD-10-CM | POA: Diagnosis not present

## 2017-04-12 DIAGNOSIS — M65811 Other synovitis and tenosynovitis, right shoulder: Secondary | ICD-10-CM | POA: Diagnosis not present

## 2017-04-12 DIAGNOSIS — M25511 Pain in right shoulder: Secondary | ICD-10-CM | POA: Diagnosis not present

## 2017-04-19 DIAGNOSIS — Z79899 Other long term (current) drug therapy: Secondary | ICD-10-CM | POA: Diagnosis not present

## 2017-04-19 DIAGNOSIS — G894 Chronic pain syndrome: Secondary | ICD-10-CM | POA: Diagnosis not present

## 2017-04-19 DIAGNOSIS — G90519 Complex regional pain syndrome I of unspecified upper limb: Secondary | ICD-10-CM | POA: Diagnosis not present

## 2017-04-19 DIAGNOSIS — Z79891 Long term (current) use of opiate analgesic: Secondary | ICD-10-CM | POA: Diagnosis not present

## 2017-04-19 DIAGNOSIS — M25511 Pain in right shoulder: Secondary | ICD-10-CM | POA: Diagnosis not present

## 2017-04-20 DIAGNOSIS — Z23 Encounter for immunization: Secondary | ICD-10-CM | POA: Diagnosis not present

## 2017-04-20 DIAGNOSIS — M25511 Pain in right shoulder: Secondary | ICD-10-CM | POA: Diagnosis not present

## 2017-05-17 DIAGNOSIS — G90519 Complex regional pain syndrome I of unspecified upper limb: Secondary | ICD-10-CM | POA: Diagnosis not present

## 2017-05-17 DIAGNOSIS — M25511 Pain in right shoulder: Secondary | ICD-10-CM | POA: Diagnosis not present

## 2017-05-17 DIAGNOSIS — Z79891 Long term (current) use of opiate analgesic: Secondary | ICD-10-CM | POA: Diagnosis not present

## 2017-05-17 DIAGNOSIS — G894 Chronic pain syndrome: Secondary | ICD-10-CM | POA: Diagnosis not present

## 2017-05-17 DIAGNOSIS — Z79899 Other long term (current) drug therapy: Secondary | ICD-10-CM | POA: Diagnosis not present

## 2017-06-15 DIAGNOSIS — Z79899 Other long term (current) drug therapy: Secondary | ICD-10-CM | POA: Diagnosis not present

## 2017-06-15 DIAGNOSIS — G90519 Complex regional pain syndrome I of unspecified upper limb: Secondary | ICD-10-CM | POA: Diagnosis not present

## 2017-06-15 DIAGNOSIS — Z79891 Long term (current) use of opiate analgesic: Secondary | ICD-10-CM | POA: Diagnosis not present

## 2017-06-15 DIAGNOSIS — M25511 Pain in right shoulder: Secondary | ICD-10-CM | POA: Diagnosis not present

## 2017-06-15 DIAGNOSIS — G894 Chronic pain syndrome: Secondary | ICD-10-CM | POA: Diagnosis not present

## 2017-07-13 DIAGNOSIS — G90519 Complex regional pain syndrome I of unspecified upper limb: Secondary | ICD-10-CM | POA: Diagnosis not present

## 2017-07-13 DIAGNOSIS — Z79899 Other long term (current) drug therapy: Secondary | ICD-10-CM | POA: Diagnosis not present

## 2017-07-13 DIAGNOSIS — M25511 Pain in right shoulder: Secondary | ICD-10-CM | POA: Diagnosis not present

## 2017-07-13 DIAGNOSIS — Z79891 Long term (current) use of opiate analgesic: Secondary | ICD-10-CM | POA: Diagnosis not present

## 2017-07-13 DIAGNOSIS — G894 Chronic pain syndrome: Secondary | ICD-10-CM | POA: Diagnosis not present

## 2017-07-20 ENCOUNTER — Other Ambulatory Visit: Payer: Self-pay | Admitting: Adult Health

## 2017-07-20 ENCOUNTER — Other Ambulatory Visit: Payer: Self-pay | Admitting: Neurology

## 2017-07-26 DIAGNOSIS — R7302 Impaired glucose tolerance (oral): Secondary | ICD-10-CM | POA: Diagnosis not present

## 2017-07-26 DIAGNOSIS — Z0000 Encounter for general adult medical examination without abnormal findings: Secondary | ICD-10-CM | POA: Diagnosis not present

## 2017-07-29 DIAGNOSIS — R82998 Other abnormal findings in urine: Secondary | ICD-10-CM | POA: Diagnosis not present

## 2017-07-29 DIAGNOSIS — E875 Hyperkalemia: Secondary | ICD-10-CM | POA: Diagnosis not present

## 2017-08-02 DIAGNOSIS — R7302 Impaired glucose tolerance (oral): Secondary | ICD-10-CM | POA: Diagnosis not present

## 2017-08-02 DIAGNOSIS — Z1389 Encounter for screening for other disorder: Secondary | ICD-10-CM | POA: Diagnosis not present

## 2017-08-02 DIAGNOSIS — Z0000 Encounter for general adult medical examination without abnormal findings: Secondary | ICD-10-CM | POA: Diagnosis not present

## 2017-08-02 DIAGNOSIS — E875 Hyperkalemia: Secondary | ICD-10-CM | POA: Diagnosis not present

## 2017-08-08 DIAGNOSIS — G90519 Complex regional pain syndrome I of unspecified upper limb: Secondary | ICD-10-CM | POA: Diagnosis not present

## 2017-08-08 DIAGNOSIS — Z79891 Long term (current) use of opiate analgesic: Secondary | ICD-10-CM | POA: Diagnosis not present

## 2017-08-08 DIAGNOSIS — G894 Chronic pain syndrome: Secondary | ICD-10-CM | POA: Diagnosis not present

## 2017-08-08 DIAGNOSIS — M25511 Pain in right shoulder: Secondary | ICD-10-CM | POA: Diagnosis not present

## 2017-08-08 DIAGNOSIS — Z79899 Other long term (current) drug therapy: Secondary | ICD-10-CM | POA: Diagnosis not present

## 2017-09-07 DIAGNOSIS — G894 Chronic pain syndrome: Secondary | ICD-10-CM | POA: Diagnosis not present

## 2017-09-07 DIAGNOSIS — Z79891 Long term (current) use of opiate analgesic: Secondary | ICD-10-CM | POA: Diagnosis not present

## 2017-09-07 DIAGNOSIS — Z79899 Other long term (current) drug therapy: Secondary | ICD-10-CM | POA: Diagnosis not present

## 2017-09-07 DIAGNOSIS — G90519 Complex regional pain syndrome I of unspecified upper limb: Secondary | ICD-10-CM | POA: Diagnosis not present

## 2017-09-07 DIAGNOSIS — M25511 Pain in right shoulder: Secondary | ICD-10-CM | POA: Diagnosis not present

## 2017-10-05 DIAGNOSIS — M79641 Pain in right hand: Secondary | ICD-10-CM | POA: Diagnosis not present

## 2017-10-05 DIAGNOSIS — G894 Chronic pain syndrome: Secondary | ICD-10-CM | POA: Diagnosis not present

## 2017-10-05 DIAGNOSIS — Z79891 Long term (current) use of opiate analgesic: Secondary | ICD-10-CM | POA: Diagnosis not present

## 2017-10-05 DIAGNOSIS — Z79899 Other long term (current) drug therapy: Secondary | ICD-10-CM | POA: Diagnosis not present

## 2017-10-05 DIAGNOSIS — G90519 Complex regional pain syndrome I of unspecified upper limb: Secondary | ICD-10-CM | POA: Diagnosis not present

## 2017-11-02 DIAGNOSIS — G90519 Complex regional pain syndrome I of unspecified upper limb: Secondary | ICD-10-CM | POA: Diagnosis not present

## 2017-11-02 DIAGNOSIS — G894 Chronic pain syndrome: Secondary | ICD-10-CM | POA: Diagnosis not present

## 2017-11-02 DIAGNOSIS — Z79891 Long term (current) use of opiate analgesic: Secondary | ICD-10-CM | POA: Diagnosis not present

## 2017-11-02 DIAGNOSIS — Z79899 Other long term (current) drug therapy: Secondary | ICD-10-CM | POA: Diagnosis not present

## 2017-11-02 DIAGNOSIS — M25511 Pain in right shoulder: Secondary | ICD-10-CM | POA: Diagnosis not present

## 2017-11-16 DIAGNOSIS — Z79891 Long term (current) use of opiate analgesic: Secondary | ICD-10-CM | POA: Diagnosis not present

## 2017-11-16 DIAGNOSIS — G90519 Complex regional pain syndrome I of unspecified upper limb: Secondary | ICD-10-CM | POA: Diagnosis not present

## 2017-11-16 DIAGNOSIS — G894 Chronic pain syndrome: Secondary | ICD-10-CM | POA: Diagnosis not present

## 2017-11-16 DIAGNOSIS — Z79899 Other long term (current) drug therapy: Secondary | ICD-10-CM | POA: Diagnosis not present

## 2017-11-16 DIAGNOSIS — M79641 Pain in right hand: Secondary | ICD-10-CM | POA: Diagnosis not present

## 2017-12-15 DIAGNOSIS — G90519 Complex regional pain syndrome I of unspecified upper limb: Secondary | ICD-10-CM | POA: Diagnosis not present

## 2017-12-15 DIAGNOSIS — M79641 Pain in right hand: Secondary | ICD-10-CM | POA: Diagnosis not present

## 2017-12-15 DIAGNOSIS — G894 Chronic pain syndrome: Secondary | ICD-10-CM | POA: Diagnosis not present

## 2017-12-15 DIAGNOSIS — Z79899 Other long term (current) drug therapy: Secondary | ICD-10-CM | POA: Diagnosis not present

## 2017-12-15 DIAGNOSIS — Z79891 Long term (current) use of opiate analgesic: Secondary | ICD-10-CM | POA: Diagnosis not present

## 2018-02-02 ENCOUNTER — Ambulatory Visit: Payer: 59 | Admitting: Neurology

## 2018-02-02 ENCOUNTER — Encounter: Payer: Self-pay | Admitting: Neurology

## 2018-02-02 ENCOUNTER — Telehealth: Payer: Self-pay | Admitting: Neurology

## 2018-02-02 VITALS — BP 124/80 | HR 64 | Ht 66.0 in | Wt 145.0 lb

## 2018-02-02 DIAGNOSIS — G4489 Other headache syndrome: Secondary | ICD-10-CM | POA: Diagnosis not present

## 2018-02-02 DIAGNOSIS — G501 Atypical facial pain: Secondary | ICD-10-CM

## 2018-02-02 DIAGNOSIS — G90511 Complex regional pain syndrome I of right upper limb: Secondary | ICD-10-CM

## 2018-02-02 DIAGNOSIS — R9089 Other abnormal findings on diagnostic imaging of central nervous system: Secondary | ICD-10-CM | POA: Diagnosis not present

## 2018-02-02 MED ORDER — GABAPENTIN 300 MG PO CAPS: 300.0000 mg | ORAL_CAPSULE | Freq: Three times a day (TID) | ORAL | 3 refills | Status: DC

## 2018-02-02 MED ORDER — ALPRAZOLAM 0.5 MG PO TABS: ORAL_TABLET | ORAL | 0 refills | Status: DC

## 2018-02-02 MED ORDER — TOPIRAMATE 50 MG PO TABS: ORAL_TABLET | ORAL | 3 refills | Status: DC

## 2018-02-02 NOTE — Progress Notes (Signed)
Reason for visit: Atypical facial pain  [name] is an 58 y.o. female  History of present illness:  [name] is a 57 year old right-handed white female with a history of left maxillary atypical facial pain.  The patient has responded to some degree with the combination of Topamax and gabapentin.  The patient still has daily pain but is better controlled with this regimen.  The patient tolerates medications fairly well.  She continues to have right hand pain felt secondary to reflex sympathetic dystrophy, she is followed through a pain center for this.  She sleeps fairly well at night but she remains fatigued during the day, the fatigue has worsened over the last 8 to 10 months.  She in the past has had abnormal findings on MRI of the brain with a moderate level of white matter changes.  Work-up for multiple sclerosis was negative, the patient does not have a history of hypertension.  The patient returns for an evaluation.  Past Medical History:  Diagnosis Date  . Abnormal brain MRI 07/26/2013  . Anxiety   . Arthritis   . Atypical facial pain   . Chronic back pain   . Foot fracture, right   . 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 ABDOMINAL HYSTERECTOMY  2012    Family History  Problem Relation Age of Onset  . Coronary artery disease Father   . Prostate cancer Sister   . Hypertension Brother   . Congestive Heart Failure Mother     Social history:  reports that she has been smoking cigarettes. She has been smoking about 2.00 packs per day. She has never used smokeless tobacco. She reports that she drinks alcohol. She reports that she does not use drugs.    Allergies  Allergen Reactions  . Prednisone     Medications:  Prior to Admission medications   Medication Sig Start Date End Date Taking? Authorizing Provider  atorvastatin (LIPITOR) 10 MG tablet Take 10 mg by mouth daily. 01/17/18  Yes [provider]  EVZIO 2  MG/0.4ML SOAJ INJECT ONE 0.4ML INJECTION INTO OUTER THIGH AS NEEDED FOR SUSPECTED OPIOID OVERDOSE. CALL 911. 12/14/16  Yes [provider]  fenofibrate (TRICOR) 145 MG tablet Take 145 mg by mouth daily. with food 01/25/17  Yes [provider]  gabapentin (NEURONTIN) 300 MG capsule Take 1 capsule (300 mg total) by mouth 3 (three) times daily. 02/02/18  Yes Kathrynn Ducking, MD  HYDROcodone-acetaminophen [facility]) 10-325 MG per tablet Take 2 tablets by mouth daily as needed.  02/21/14  Yes [provider]  sertraline (ZOLOFT) 50 MG tablet Take 50 mg by mouth daily.  12/25/14  Yes [provider]  topiramate (TOPAMAX) 50 MG tablet TAKE 1 AND 1/2 TABLETS BY MOUTH 2 TIMES DAILY. 02/02/18  Yes Kathrynn Ducking, MD  triamcinolone cream (KENALOG) 0.1 % Apply 1 application topically 2 (two) times daily.  11/15/12  Yes [provider]  ZOHYDRO ER 30 MG C12A TAKE ONE CAPSULE BY MOUTH EVERY TWELVE HOURS. 01/27/17  Yes [provider]    ROS:  Out of a complete 14 system review of symptoms, the patient complains only of the following symptoms, and all other reviewed systems are negative.  Fatigue Ringing in the ears Eye itching Joint pain, joint swelling  Blood pressure 124/80, pulse 64, height 5\' 6"  (1.676 m), weight 145 lb (65.8 kg).  Physical Exam  General: The patient is alert  and cooperative at the time of the examination.  Skin: No significant peripheral edema is noted.   Neurologic Exam  Mental status: The patient is alert and oriented x 3 at the time of the examination. The patient has apparent normal recent and remote memory, with an apparently normal attention span and concentration ability.   Cranial nerves: Facial symmetry is present. Speech is normal, no aphasia or dysarthria is noted. Extraocular movements are full. Visual fields are full.  Motor: The patient has good strength in all 4 extremities.  Sensory examination: Soft touch  sensation is symmetric on the face, arms, and legs, with exception some decreased soft touch sensation on the right hand.  Coordination: The patient has good finger-nose-finger and heel-to-shin bilaterally.  Gait and station: The patient has a normal gait. Tandem gait is normal. Romberg is negative. No drift is seen.  Reflexes: Deep tendon reflexes are symmetric.   Assessment/Plan:  1.  Atypical facial pain, left maxillary area  2.  Reflex sympathetic dystrophy, right hand  3.  Abnormal MRI brain  The patient will be set up for MRI of the brain, compared to one done in 2015.  The patient will be given prescriptions for gabapentin and Topamax, she will follow-up in 1 year.  Jill Alexanders MD 02/02/2018 1:41 PM  [facility] [address] [place], [place] [address]  Phone [phone] Fax [phone]

## 2018-02-02 NOTE — Telephone Encounter (Signed)
[place]: [id number] (exp. 02/02/18 to 03/19/18) order sent to GI. They will reach out to the pt to schedule.

## 2018-02-09 DIAGNOSIS — Z79899 Other long term (current) drug therapy: Secondary | ICD-10-CM | POA: Diagnosis not present

## 2018-02-09 DIAGNOSIS — M25511 Pain in right shoulder: Secondary | ICD-10-CM | POA: Diagnosis not present

## 2018-02-09 DIAGNOSIS — M79641 Pain in right hand: Secondary | ICD-10-CM | POA: Diagnosis not present

## 2018-02-09 DIAGNOSIS — Z79891 Long term (current) use of opiate analgesic: Secondary | ICD-10-CM | POA: Diagnosis not present

## 2018-02-09 DIAGNOSIS — G894 Chronic pain syndrome: Secondary | ICD-10-CM | POA: Diagnosis not present

## 2018-02-22 ENCOUNTER — Other Ambulatory Visit: Payer: 59

## 2018-02-23 ENCOUNTER — Ambulatory Visit
Admission: RE | Admit: 2018-02-23 | Discharge: 2018-02-23 | Disposition: A | Discharge status: Home / Self Care | Payer: 59 | Source: Ambulatory Visit | Attending: Neurology | Admitting: Neurology

## 2018-02-23 DIAGNOSIS — R51 Headache: Secondary | ICD-10-CM | POA: Diagnosis not present

## 2018-02-23 DIAGNOSIS — G4489 Other headache syndrome: Secondary | ICD-10-CM | POA: Diagnosis not present

## 2018-02-23 DIAGNOSIS — R9089 Other abnormal findings on diagnostic imaging of central nervous system: Secondary | ICD-10-CM

## 2018-02-24 ENCOUNTER — Telehealth: Payer: Self-pay | Admitting: Neurology

## 2018-02-24 NOTE — Telephone Encounter (Signed)
  I called patient.  MRI of the brain shows no change from 2015, no evidence of progression that would suggest a myelinating disease.  The patient does not have a history of hypertension although the findings look typical for chronic small vessel disease.   MRI brain 02/23/18:  IMPRESSION:   MRI brain (without) demonstrating: - Mild-moderate periventricular and subcortical and pontine T2 hyperintensities, likely chronic small vessel ischemic disease. - No acute findings. - No change from MRI on 08/27/13.

## 2018-03-09 DIAGNOSIS — M79641 Pain in right hand: Secondary | ICD-10-CM | POA: Diagnosis not present

## 2018-03-09 DIAGNOSIS — G894 Chronic pain syndrome: Secondary | ICD-10-CM | POA: Diagnosis not present

## 2018-03-09 DIAGNOSIS — Z79891 Long term (current) use of opiate analgesic: Secondary | ICD-10-CM | POA: Diagnosis not present

## 2018-03-09 DIAGNOSIS — Z79899 Other long term (current) drug therapy: Secondary | ICD-10-CM | POA: Diagnosis not present

## 2018-03-09 DIAGNOSIS — G90519 Complex regional pain syndrome I of unspecified upper limb: Secondary | ICD-10-CM | POA: Diagnosis not present

## 2018-04-06 DIAGNOSIS — Z79899 Other long term (current) drug therapy: Secondary | ICD-10-CM | POA: Diagnosis not present

## 2018-04-06 DIAGNOSIS — M79641 Pain in right hand: Secondary | ICD-10-CM | POA: Diagnosis not present

## 2018-04-06 DIAGNOSIS — Z79891 Long term (current) use of opiate analgesic: Secondary | ICD-10-CM | POA: Diagnosis not present

## 2018-04-06 DIAGNOSIS — G894 Chronic pain syndrome: Secondary | ICD-10-CM | POA: Diagnosis not present

## 2018-04-06 DIAGNOSIS — M79609 Pain in unspecified limb: Secondary | ICD-10-CM | POA: Diagnosis not present

## 2018-04-18 DIAGNOSIS — Z23 Encounter for immunization: Secondary | ICD-10-CM | POA: Diagnosis not present

## 2018-05-04 DIAGNOSIS — G894 Chronic pain syndrome: Secondary | ICD-10-CM | POA: Diagnosis not present

## 2018-05-04 DIAGNOSIS — M79641 Pain in right hand: Secondary | ICD-10-CM | POA: Diagnosis not present

## 2018-05-04 DIAGNOSIS — G90519 Complex regional pain syndrome I of unspecified upper limb: Secondary | ICD-10-CM | POA: Diagnosis not present

## 2018-06-08 DIAGNOSIS — Z79899 Other long term (current) drug therapy: Secondary | ICD-10-CM | POA: Diagnosis not present

## 2018-06-08 DIAGNOSIS — Z79891 Long term (current) use of opiate analgesic: Secondary | ICD-10-CM | POA: Diagnosis not present

## 2018-06-08 DIAGNOSIS — G894 Chronic pain syndrome: Secondary | ICD-10-CM | POA: Diagnosis not present

## 2018-06-08 DIAGNOSIS — M79641 Pain in right hand: Secondary | ICD-10-CM | POA: Diagnosis not present

## 2018-06-08 DIAGNOSIS — G90519 Complex regional pain syndrome I of unspecified upper limb: Secondary | ICD-10-CM | POA: Diagnosis not present

## 2018-07-06 DIAGNOSIS — G894 Chronic pain syndrome: Secondary | ICD-10-CM | POA: Diagnosis not present

## 2018-07-06 DIAGNOSIS — M79641 Pain in right hand: Secondary | ICD-10-CM | POA: Diagnosis not present

## 2018-07-28 DIAGNOSIS — Z0000 Encounter for general adult medical examination without abnormal findings: Secondary | ICD-10-CM | POA: Diagnosis not present

## 2018-07-28 DIAGNOSIS — R7302 Impaired glucose tolerance (oral): Secondary | ICD-10-CM | POA: Diagnosis not present

## 2018-07-28 DIAGNOSIS — R82998 Other abnormal findings in urine: Secondary | ICD-10-CM | POA: Diagnosis not present

## 2018-08-03 DIAGNOSIS — Z79891 Long term (current) use of opiate analgesic: Secondary | ICD-10-CM | POA: Diagnosis not present

## 2018-08-03 DIAGNOSIS — G894 Chronic pain syndrome: Secondary | ICD-10-CM | POA: Diagnosis not present

## 2018-08-03 DIAGNOSIS — G90519 Complex regional pain syndrome I of unspecified upper limb: Secondary | ICD-10-CM | POA: Diagnosis not present

## 2018-08-03 DIAGNOSIS — Z79899 Other long term (current) drug therapy: Secondary | ICD-10-CM | POA: Diagnosis not present

## 2018-08-03 DIAGNOSIS — M79641 Pain in right hand: Secondary | ICD-10-CM | POA: Diagnosis not present

## 2018-08-04 DIAGNOSIS — R7302 Impaired glucose tolerance (oral): Secondary | ICD-10-CM | POA: Diagnosis not present

## 2018-08-04 DIAGNOSIS — Z0000 Encounter for general adult medical examination without abnormal findings: Secondary | ICD-10-CM | POA: Diagnosis not present

## 2018-08-04 DIAGNOSIS — E781 Pure hyperglyceridemia: Secondary | ICD-10-CM | POA: Diagnosis not present

## 2018-08-04 DIAGNOSIS — F17210 Nicotine dependence, cigarettes, uncomplicated: Secondary | ICD-10-CM | POA: Diagnosis not present

## 2018-09-07 DIAGNOSIS — G90519 Complex regional pain syndrome I of unspecified upper limb: Secondary | ICD-10-CM | POA: Diagnosis not present

## 2018-09-07 DIAGNOSIS — G894 Chronic pain syndrome: Secondary | ICD-10-CM | POA: Diagnosis not present

## 2018-09-07 DIAGNOSIS — M79609 Pain in unspecified limb: Secondary | ICD-10-CM | POA: Diagnosis not present

## 2018-10-05 DIAGNOSIS — M25511 Pain in right shoulder: Secondary | ICD-10-CM | POA: Diagnosis not present

## 2018-10-05 DIAGNOSIS — Z79891 Long term (current) use of opiate analgesic: Secondary | ICD-10-CM | POA: Diagnosis not present

## 2018-10-05 DIAGNOSIS — Z79899 Other long term (current) drug therapy: Secondary | ICD-10-CM | POA: Diagnosis not present

## 2018-10-05 DIAGNOSIS — G90519 Complex regional pain syndrome I of unspecified upper limb: Secondary | ICD-10-CM | POA: Diagnosis not present

## 2018-10-05 DIAGNOSIS — G894 Chronic pain syndrome: Secondary | ICD-10-CM | POA: Diagnosis not present

## 2018-11-02 DIAGNOSIS — M79641 Pain in right hand: Secondary | ICD-10-CM | POA: Diagnosis not present

## 2018-11-02 DIAGNOSIS — Z79891 Long term (current) use of opiate analgesic: Secondary | ICD-10-CM | POA: Diagnosis not present

## 2018-11-02 DIAGNOSIS — G894 Chronic pain syndrome: Secondary | ICD-10-CM | POA: Diagnosis not present

## 2018-11-02 DIAGNOSIS — M25511 Pain in right shoulder: Secondary | ICD-10-CM | POA: Diagnosis not present

## 2018-11-02 DIAGNOSIS — Z79899 Other long term (current) drug therapy: Secondary | ICD-10-CM | POA: Diagnosis not present

## 2019-01-31 ENCOUNTER — Other Ambulatory Visit: Payer: Self-pay | Admitting: Neurology

## 2019-02-08 ENCOUNTER — Ambulatory Visit: Payer: 59 | Admitting: Neurology

## 2019-03-05 ENCOUNTER — Other Ambulatory Visit: Payer: Self-pay | Admitting: Neurology

## 2019-05-16 ENCOUNTER — Ambulatory Visit: Payer: 59 | Admitting: Neurology

## 2019-05-30 NOTE — Progress Notes (Addendum)
PATIENT: [name] DOB: 04/29/1961  REASON FOR VISIT: follow up HISTORY FROM: patient  HISTORY OF PRESENT ILLNESS: Today 05/31/19  [name] is a 58 year old female with history of left maxillary atypical facial pain.  She has responded moderately well to Topamax and gabapentin.  Her pain is fairly well controlled with her current regimen.  If she has breakthrough pain, she may take an extra gabapentin.  She had MRI of the brain in September 2019, showing no change from 2015.  There was no evidence of progression to suggest a demyelinating disease.  She does not have history of hypertension, but the findings are typical for chronic small vessel disease.  She continues to follow with the pain center for right hand pain felt secondary to reflex sympathetic dystrophy. She does report an occasional intermittent tremor, that is positional to her left hand. She indicates her overall health has been good.  She presents today for evaluation.  HISTORY 02/02/2018 Dr. Jannifer [name]: [name] is a 58 year old right-handed white female with a history of left maxillary atypical facial pain.  The patient has responded to some degree with the combination of Topamax and gabapentin.  The patient still has daily pain but is better controlled with this regimen.  The patient tolerates medications fairly well.  She continues to have right hand pain felt secondary to reflex sympathetic dystrophy, she is followed through a pain center for this.  She sleeps fairly well at night but she remains fatigued during the day, the fatigue has worsened over the last 8 to 10 months.  She in the past has had abnormal findings on MRI of the brain with a moderate level of white matter changes.  Work-up for multiple sclerosis was negative, the patient does not have a history of hypertension.  The patient returns for an evaluation.  REVIEW OF SYSTEMS: Out of a complete 14 system review of symptoms, the patient complains only of the  following symptoms, and all other reviewed systems are negative.  Facial pain  ALLERGIES: Allergies  Allergen Reactions  . Prednisone     HOME MEDICATIONS: Outpatient Medications Prior to Visit  Medication Sig Dispense Refill  . atorvastatin (LIPITOR) 10 MG tablet Take 10 mg by mouth daily.  3  . calcium carbonate (TUMS - DOSED IN MG ELEMENTAL CALCIUM) 500 MG chewable tablet Chew 1 tablet by mouth daily. OTC PRN    . cetirizine (ZYRTEC) 10 MG tablet Take 10 mg by mouth daily as needed for allergies. OTC PRN    . EVZIO 2 MG/0.4ML SOAJ INJECT ONE 0.4ML INJECTION INTO OUTER THIGH AS NEEDED FOR SUSPECTED OPIOID OVERDOSE. CALL 911.  0  . fenofibrate (TRICOR) 145 MG tablet Take 145 mg by mouth daily. with food  12  . gabapentin (NEURONTIN) 300 MG capsule TAKE 1 CAPSULE BY MOUTH THREE TIMES A DAY 90 capsule 11  . HYDROcodone-acetaminophen (NORCO) 10-325 MG per tablet Take 2 tablets by mouth daily as needed.     . sertraline (ZOLOFT) 50 MG tablet Take 50 mg by mouth daily.   3  . topiramate (TOPAMAX) 50 MG tablet TAKE 1 AND 1/2 TABLETS BY MOUTH 2 TIMES DAILY. 90 tablet 2  . triamcinolone cream (KENALOG) 0.1 % Apply 1 application topically 2 (two) times daily.     Marland Kitchen ZOHYDRO ER 30 MG C12A TAKE ONE CAPSULE BY MOUTH EVERY TWELVE HOURS.  0  . ALPRAZolam (XANAX) 0.5 MG tablet Take 2 tablets approximately 45 minutes prior to the MRI study,  take a third tablet if needed. (Patient not taking: Reported on 05/31/2019) 3 tablet 0   No facility-administered medications prior to visit.    PAST MEDICAL HISTORY: Past Medical History:  Diagnosis Date  . Abnormal brain MRI 07/26/2013  . Anxiety   . Arthritis   . Atypical facial pain   . Chronic back pain   . Foot fracture, right   . 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 ABDOMINAL HYSTERECTOMY  2012    FAMILY HISTORY: Family History  Problem Relation Age of Onset  . Coronary  artery disease Father   . Prostate cancer Sister   . Hypertension Brother   . Congestive Heart Failure Mother     SOCIAL HISTORY: Social History   Socioeconomic History  . Marital status: Divorced    Spouse name: Not on file  . Number of children: 0  . Years of education: Not on file  . Highest education level: Not on file  Occupational History    Employer: [employer]  Tobacco Use  . Smoking status: Current Every Day Smoker    Packs/day: 2.00    Types: Cigarettes  . Smokeless tobacco: Never Used  Substance and Sexual Activity  . Alcohol use: Yes    Comment: Consumes beer on the weekends only  . Drug use: No  . Sexual activity: Not on file  Other Topics Concern  . Not on file  Social History Narrative  . Not on file   Social Determinants of Health   Financial Resource Strain:   . Difficulty of Paying Living Expenses: Not on file  Food Insecurity:   . Worried About Charity fundraiser in the Last Year: Not on file  . Ran Out of Food in the Last Year: Not on file  Transportation Needs:   . Lack of Transportation (Medical): Not on file  . Lack of Transportation (Non-Medical): Not on file  Physical Activity:   . Days of Exercise per Week: Not on file  . Minutes of Exercise per Session: Not on file  Stress:   . Feeling of Stress : Not on file  Social Connections:   . Frequency of Communication with Friends and Family: Not on file  . Frequency of Social Gatherings with Friends and Family: Not on file  . Attends Religious Services: Not on file  . Active Member of Clubs or Organizations: Not on file  . Attends Archivist Meetings: Not on file  . Marital Status: Not on file  Intimate Partner Violence:   . Fear of Current or Ex-Partner: Not on file  . Emotionally Abused: Not on file  . Physically Abused: Not on file  . Sexually Abused: Not on file   PHYSICAL EXAM  Vitals:   05/31/19 1524  BP: 133/71  Pulse: 60  Temp: 97.7 F (36.5 C)  Weight:  146 lb (66.2 kg)  Height: 5' 6"  (1.676 m)   Body mass index is 23.57 kg/m.  Generalized: Well developed, in no acute distress   Neurological examination  Mentation: Alert oriented to time, place, history taking. Follows all commands speech and language fluent Cranial nerve II-XII: Pupils were equal round reactive to light. Extraocular movements were full, visual field were full on confrontational test. Facial sensation and strength were normal. Head turning and shoulder shrug  were normal and symmetric. Motor: The motor testing reveals 5 over 5 strength of all 4 extremities. Good symmetric motor  tone is noted throughout.  Sensory: Sensory testing is intact to soft touch on all 4 extremities. No evidence of extinction is noted.  Coordination: Cerebellar testing reveals good finger-nose-finger and heel-to-shin bilaterally.  Gait and station: Gait is normal. Tandem gait is normal. Romberg is negative. No drift is seen.  Reflexes: Deep tendon reflexes are symmetric and normal bilaterally.   DIAGNOSTIC DATA (LABS, IMAGING, TESTING) - I reviewed patient records, labs, notes, testing and imaging myself where available.  Lab Results  Component Value Date   WBC 10.2 08/20/2010   HGB 15.7 (H) 08/20/2010   HCT 45.7 08/20/2010   MCV 94.4 08/20/2010   PLT 195 08/20/2010      Component Value Date/Time   NA 140 08/20/2010 0903   K 4.4 08/20/2010 0903   CL 106 08/20/2010 0903   CO2 23 08/20/2010 0903   GLUCOSE 120 (H) 08/20/2010 0903   BUN 12 08/20/2010 0903   CREATININE 0.80 08/20/2010 0903   CALCIUM 9.6 08/20/2010 0903   ALBUMIN 4.7 01/19/2013 1318   GFRNONAA >60 08/20/2010 0903   GFRAA  08/20/2010 0903    >60        The eGFR has been calculated using the MDRD equation. This calculation has not been validated in all clinical situations. eGFR's persistently <60 mL/min signify possible Chronic Kidney Disease.   No results found for: CHOL, HDL, LDLCALC, LDLDIRECT, TRIG, CHOLHDL No  results found for: HGBA1C No results found for: VITAMINB12 No results found for: TSH  ASSESSMENT AND PLAN 58 y.o. year old female  has a past medical history of Abnormal brain MRI (07/26/2013), Anxiety, Arthritis, Atypical facial pain, Chronic back pain, Foot fracture, right, and RSD upper limb (03/07/2014). here with:  1.  Atypical facial pain, left maxillary area 2.  Reflex sympathetic dystrophy, right hand 3.  Abnormal MRI of the brain  Repeat MRI of the brain in 2019 shows no change compared to 2015.  There is no evidence of progression that would suggest a demyelinating disease.  She does not have history of hypertension, but the findings look typical for chronic small vessel disease.  She will remain on Topamax and gabapentin.  This works to adequately control her left facial pain.  She will follow-up in 1 year or sooner if needed.  I did advise if her symptoms worsen or she develops any new symptoms she should let us know.  I spent 15 minutes with the patient. 50% of this time was spent discussing her plan of care.  Butler Denmark, AGNP-C, DNP 05/31/2019, 3:45 PM [facility] [address], [place], [place] [address] [phone]  I have read the note, and I agree with the clinical assessment and plan.  Kathrynn Ducking

## 2019-05-31 ENCOUNTER — Encounter: Payer: Self-pay | Admitting: Neurology

## 2019-05-31 ENCOUNTER — Other Ambulatory Visit: Payer: Self-pay

## 2019-05-31 ENCOUNTER — Ambulatory Visit: Payer: 59 | Admitting: Neurology

## 2019-05-31 VITALS — BP 133/71 | HR 60 | Temp 97.7°F | Ht 66.0 in | Wt 146.0 lb

## 2019-05-31 DIAGNOSIS — G501 Atypical facial pain: Secondary | ICD-10-CM

## 2019-05-31 MED ORDER — TOPIRAMATE 50 MG PO TABS: ORAL_TABLET | ORAL | 11 refills | Status: DC

## 2019-05-31 NOTE — Patient Instructions (Signed)
Continue current medications   Return in 1 year or sooner if needed

## 2020-01-22 ENCOUNTER — Other Ambulatory Visit: Payer: Self-pay | Admitting: Neurology

## 2020-05-16 ENCOUNTER — Other Ambulatory Visit: Payer: Self-pay | Admitting: Neurology

## 2020-06-03 ENCOUNTER — Ambulatory Visit: Payer: 59 | Admitting: Neurology

## 2020-06-04 ENCOUNTER — Other Ambulatory Visit: Payer: Self-pay | Admitting: Neurology

## 2020-06-16 ENCOUNTER — Other Ambulatory Visit: Payer: Self-pay | Admitting: Neurology

## 2020-07-10 ENCOUNTER — Other Ambulatory Visit: Payer: Self-pay | Admitting: Neurology

## 2020-08-04 ENCOUNTER — Other Ambulatory Visit: Payer: Self-pay | Admitting: Neurology

## 2020-08-14 ENCOUNTER — Ambulatory Visit: Payer: 59 | Admitting: Neurology

## 2020-08-21 NOTE — Progress Notes (Signed)
PATIENT: [name] DOB: 07/07/1960  REASON FOR VISIT: follow up HISTORY FROM: patient  HISTORY OF PRESENT ILLNESS: Today 08/25/20  [name] is a 60 year old with history of left maxillary atypical facial pain.  Has done fairly well with Topamax and gabapentin.  If she has breakthrough pain, may take an extra gabapentin, often triggered by weather.  MRI of the brain in September 2019 showed no change from 2015, no evidence to suggest demyelinating disease.  She follows with the pain center for complex regional pain syndrome, on hydrocodone.  She developed Covid earlier this year, still has pain to the right lung with coughing.  Is a chronic smoker.  May notice tremor to both hands intermittently with action, mostly using ash tray.  Spiral drawl, handwriting sample is normal.  Here today for evaluation unaccompanied.  Update 05/31/2019 SS: [name] is a 60 year old female with history of left maxillary atypical facial pain.  She has responded moderately well to Topamax and gabapentin.  Her pain is fairly well controlled with her current regimen.  If she has breakthrough pain, she may take an extra gabapentin.  She had MRI of the brain in September 2019, showing no change from 2015.  There was no evidence of progression to suggest a demyelinating disease.  She does not have history of hypertension, but the findings are typical for chronic small vessel disease.  She continues to follow with the pain center for right hand pain felt secondary to reflex sympathetic dystrophy. She does report an occasional intermittent tremor, that is positional to her left hand. She indicates her overall health has been good.  She presents today for evaluation.  HISTORY 02/02/2018 Dr. Jannifer Franklin: [name] is a 60 year old right-handed white female with a history of left maxillary atypical facial pain.  The patient has responded to some degree with the combination of Topamax and gabapentin.  The patient still has daily  pain but is better controlled with this regimen.  The patient tolerates medications fairly well.  She continues to have right hand pain felt secondary to reflex sympathetic dystrophy, she is followed through a pain center for this.  She sleeps fairly well at night but she remains fatigued during the day, the fatigue has worsened over the last 8 to 10 months.  She in the past has had abnormal findings on MRI of the brain with a moderate level of white matter changes.  Work-up for multiple sclerosis was negative, the patient does not have a history of hypertension.  The patient returns for an evaluation.  REVIEW OF SYSTEMS: Out of a complete 14 system review of symptoms, the patient complains only of the following symptoms, and all other reviewed systems are negative.  Facial pain  ALLERGIES: Allergies  Allergen Reactions  . Prednisone     HOME MEDICATIONS: Outpatient Medications Prior to Visit  Medication Sig Dispense Refill  . atorvastatin (LIPITOR) 10 MG tablet Take 10 mg by mouth daily.  3  . calcium carbonate (TUMS - DOSED IN MG ELEMENTAL CALCIUM) 500 MG chewable tablet Chew 1 tablet by mouth daily. OTC PRN    . cetirizine (ZYRTEC) 10 MG tablet Take 10 mg by mouth daily as needed for allergies. OTC PRN    . EVZIO 2 MG/0.4ML SOAJ INJECT ONE 0.4ML INJECTION INTO OUTER THIGH AS NEEDED FOR SUSPECTED OPIOID OVERDOSE. CALL 911.  0  . fenofibrate (TRICOR) 145 MG tablet Take 145 mg by mouth daily. with food  12  . HYDROcodone-acetaminophen (NORCO) 10-325  MG per tablet Take 2 tablets by mouth daily as needed.     . sertraline (ZOLOFT) 50 MG tablet Take 50 mg by mouth daily.   3  . triamcinolone cream (KENALOG) 0.1 % Apply 1 application topically 2 (two) times daily.     Marland Kitchen ZOHYDRO ER 30 MG C12A TAKE ONE CAPSULE BY MOUTH EVERY TWELVE HOURS.  0  . ALPRAZolam (XANAX) 0.5 MG tablet Take 2 tablets approximately 45 minutes prior to the MRI study, take a third tablet if needed. (Patient not taking:  Reported on 05/31/2019) 3 tablet 0  . gabapentin (NEURONTIN) 300 MG capsule TAKE 1 CAPSULE BY MOUTH THREE TIMES A DAY 90 capsule 11  . topiramate (TOPAMAX) 50 MG tablet TAKE 1 & 1/2 TABLETS BY MOUTH TWICE A DAY 90 tablet 0   No facility-administered medications prior to visit.    PAST MEDICAL HISTORY: Past Medical History:  Diagnosis Date  . Abnormal brain MRI 07/26/2013  . Anxiety   . Arthritis   . Atypical facial pain   . Chronic back pain   . Foot fracture, right   . 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 ABDOMINAL HYSTERECTOMY  2012    FAMILY HISTORY: Family History  Problem Relation Age of Onset  . Coronary artery disease Father   . Prostate cancer Sister   . Hypertension Brother   . Congestive Heart Failure Mother     SOCIAL HISTORY: Social History   Socioeconomic History  . Marital status: Divorced    Spouse name: Not on file  . Number of children: 0  . Years of education: Not on file  . Highest education level: Not on file  Occupational History    Employer: [employer]  Tobacco Use  . Smoking status: Current Every Day Smoker    Packs/day: 2.00    Types: Cigarettes  . Smokeless tobacco: Never Used  Substance and Sexual Activity  . Alcohol use: Yes    Comment: Consumes beer on the weekends only  . Drug use: No  . Sexual activity: Not on file  Other Topics Concern  . Not on file  Social History Narrative  . Not on file   Social Determinants of Health   Financial Resource Strain: Not on file  Food Insecurity: Not on file  Transportation Needs: Not on file  Physical Activity: Not on file  Stress: Not on file  Social Connections: Not on file  Intimate Partner Violence: Not on file   PHYSICAL EXAM  Vitals:   08/25/20 1542  BP: 127/80  Pulse: 79  Weight: 137 lb (62.1 kg)  Height: _0  (1.676 m)   Body mass index is 22.11 kg/m.  Generalized: Well developed, in no acute  distress   Neurological examination  Mentation: Alert oriented to time, place, history taking. Follows all commands speech and language fluent Cranial nerve II-XII: Pupils were equal round reactive to light. Extraocular movements were full, visual field were full on confrontational test. Facial sensation and strength were normal. Head turning and shoulder shrug  were normal and symmetric. Motor: The motor testing reveals 5 over 5 strength of all 4 extremities. Good symmetric motor tone is noted throughout.  Sensory: Sensory testing is intact to soft touch on all 4 extremities. No evidence of extinction is noted.  Coordination: Cerebellar testing reveals good finger-nose-finger and heel-to-shin bilaterally.  No tremor noted.  Handwriting and spiral drawl sample was normal. Gait and  station: Gait is normal. Tandem gait is normal. Romberg is negative. No drift is seen.  Reflexes: Deep tendon reflexes are symmetric and normal bilaterally.   DIAGNOSTIC DATA (LABS, IMAGING, TESTING) - I reviewed patient records, labs, notes, testing and imaging myself where available.  Lab Results  Component Value Date   WBC 10.2 08/20/2010   HGB 15.7 (H) 08/20/2010   HCT 45.7 08/20/2010   MCV 94.4 08/20/2010   PLT 195 08/20/2010      Component Value Date/Time   NA 140 08/20/2010 0903   K 4.4 08/20/2010 0903   CL 106 08/20/2010 0903   CO2 23 08/20/2010 0903   GLUCOSE 120 (H) 08/20/2010 0903   BUN 12 08/20/2010 0903   CREATININE 0.80 08/20/2010 0903   CALCIUM 9.6 08/20/2010 0903   ALBUMIN 4.7 01/19/2013 1318   GFRNONAA >60 08/20/2010 0903   GFRAA  08/20/2010 0903    >60        The eGFR has been calculated using the MDRD equation. This calculation has not been validated in all clinical situations. eGFR's persistently <60 mL/min signify possible Chronic Kidney Disease.   No results found for: CHOL, HDL, LDLCALC, LDLDIRECT, TRIG, CHOLHDL No results found for: HGBA1C No results found for:  VITAMINB12 No results found for: TSH  ASSESSMENT AND PLAN 60 y.o. year old female  has a past medical history of Abnormal brain MRI (07/26/2013), Anxiety, Arthritis, Atypical facial pain, Chronic back pain, Foot fracture, right, and RSD upper limb (03/07/2014). here with:  1.  Atypical facial pain, left maxillary area 2.  Reflex sympathetic dystrophy, right hand 3.  Abnormal MRI of the brain  -Overall, pain fairly well controlled -Continue Topamax, gabapentin -Consider recheck MRI of the brain at next visit, in 2019, no evidence to suggest a demyelinating disease, has moderate level white matter changes -Continue follow-up with PCP, follow-up in 1 year or sooner if needed  I spent 20 minutes of face-to-face and non-face-to-face time with patient.  This included previsit chart review, lab review, study review, order entry, electronic health record documentation, patient education.   Butler Denmark, AGNP-C, DNP 08/25/2020, 4:10 PM [facility] [address], [place], [place] [address] [phone]

## 2020-08-25 ENCOUNTER — Encounter: Payer: Self-pay | Admitting: Neurology

## 2020-08-25 ENCOUNTER — Ambulatory Visit (INDEPENDENT_AMBULATORY_CARE_PROVIDER_SITE_OTHER): Payer: 59 | Admitting: Neurology

## 2020-08-25 ENCOUNTER — Other Ambulatory Visit: Payer: Self-pay

## 2020-08-25 VITALS — BP 127/80 | HR 79 | Ht 66.0 in | Wt 137.0 lb

## 2020-08-25 DIAGNOSIS — G501 Atypical facial pain: Secondary | ICD-10-CM | POA: Diagnosis not present

## 2020-08-25 MED ORDER — GABAPENTIN 300 MG PO CAPS: ORAL_CAPSULE | ORAL | 11 refills | Status: DC

## 2020-08-25 MED ORDER — TOPIRAMATE 50 MG PO TABS: ORAL_TABLET | ORAL | 11 refills | Status: DC

## 2020-08-25 NOTE — Patient Instructions (Signed)
Continue current medications See you back in 1 year

## 2020-08-25 NOTE — Progress Notes (Signed)
I have read the note, and I agree with the clinical assessment and plan.  Britt Theard K Damarrion Mimbs   

## 2021-08-26 ENCOUNTER — Encounter: Payer: Self-pay | Admitting: Neurology

## 2021-08-26 ENCOUNTER — Ambulatory Visit: Payer: 59 | Admitting: Neurology

## 2021-08-26 ENCOUNTER — Telehealth: Payer: Self-pay | Admitting: Neurology

## 2021-08-26 VITALS — BP 134/80 | HR 83 | Ht 66.0 in | Wt 135.0 lb

## 2021-08-26 DIAGNOSIS — G501 Atypical facial pain: Secondary | ICD-10-CM | POA: Diagnosis not present

## 2021-08-26 DIAGNOSIS — R9089 Other abnormal findings on diagnostic imaging of central nervous system: Secondary | ICD-10-CM

## 2021-08-26 MED ORDER — GABAPENTIN 300 MG PO CAPS: ORAL_CAPSULE | ORAL | 11 refills | Status: DC

## 2021-08-26 MED ORDER — TOPIRAMATE 50 MG PO TABS: ORAL_TABLET | ORAL | 11 refills | Status: DC

## 2021-08-26 NOTE — Patient Instructions (Signed)
Continue current medications ?Try to eat frequent meals  ?See you back in 1 year  ?

## 2021-08-26 NOTE — Progress Notes (Signed)
PATIENT: [name] DOB: [birth year]  REASON FOR VISIT: follow up atypical facial pain HISTORY FROM: Patient PRIMARY NEUROLOGIST: Dr. Krista Blue   HISTORY OF PRESENT ILLNESS: Today 08/26/21  [name] here today for follow up. On Topamax and gabapentin. Well controlled, triggered by rain storms, moisture weather change. Will take extra gabapentin with good benefit. On hydrocodone for right hand pain. Tremor in both hands, not daily. Sometimes in morning holding coffee cup. Mildly translated into handwriting and spiral draw. Still smoking 1.5 packs a day. MRI brain in 2019 was stable from 2015, no evidence of demyelinated disease. Mild to moderate SVD. Has had tremor for at least 2 years. Only eats 1 meal a day at dinner.   Update 08/25/2020 SS: [name] is a 61 year old with history of left maxillary atypical facial pain.  Has done fairly well with Topamax and gabapentin.  If she has breakthrough pain, may take an extra gabapentin, often triggered by weather.  MRI of the brain in September 2019 showed no change from 2015, no evidence to suggest demyelinating disease.  She follows with the pain center for complex regional pain syndrome, on hydrocodone.  She developed Covid earlier this year, still has pain to the right lung with coughing.  Is a chronic smoker.  May notice tremor to both hands intermittently with action, mostly using ash tray.  Spiral drawl, handwriting sample is normal.  Here today for evaluation unaccompanied.  Update 05/31/2019 SS: [name] is a 61 year old female with history of left maxillary atypical facial pain.  She has responded moderately well to Topamax and gabapentin.  Her pain is fairly well controlled with her current regimen.  If she has breakthrough pain, she may take an extra gabapentin.  She had MRI of the brain in September 2019, showing no change from 2015.  There was no evidence of progression to suggest a demyelinating disease.  She does not have history of hypertension,  but the findings are typical for chronic small vessel disease.  She continues to follow with the pain center for right hand pain felt secondary to reflex sympathetic dystrophy. She does report an occasional intermittent tremor, that is positional to her left hand. She indicates her overall health has been good.  She presents today for evaluation.  HISTORY 02/02/2018 Dr. Jannifer Franklin: [name] is a 61 year old right-handed white female with a history of left maxillary atypical facial pain.  The patient has responded to some degree with the combination of Topamax and gabapentin.  The patient still has daily pain but is better controlled with this regimen.  The patient tolerates medications fairly well.  She continues to have right hand pain felt secondary to reflex sympathetic dystrophy, she is followed through a pain center for this.  She sleeps fairly well at night but she remains fatigued during the day, the fatigue has worsened over the last 8 to 10 months.  She in the past has had abnormal findings on MRI of the brain with a moderate level of white matter changes.  Work-up for multiple sclerosis was negative, the patient does not have a history of hypertension.  The patient returns for an evaluation.  REVIEW OF SYSTEMS: Out of a complete 14 system review of symptoms, the patient complains only of the following symptoms, and all other reviewed systems are negative.  See HPI  ALLERGIES: Allergies  Allergen Reactions   Prednisone     HOME MEDICATIONS: Outpatient Medications Prior to Visit  Medication Sig Dispense Refill   atorvastatin (  LIPITOR) 10 MG tablet Take 10 mg by mouth daily.  3   calcium carbonate (TUMS - DOSED IN MG ELEMENTAL CALCIUM) 500 MG chewable tablet Chew 1 tablet by mouth daily. OTC PRN     cetirizine (ZYRTEC) 10 MG tablet Take 10 mg by mouth daily as needed for allergies. OTC PRN     fenofibrate (TRICOR) 145 MG tablet Take 145 mg by mouth daily. with food  12   gabapentin  (NEURONTIN) 300 MG capsule TAKE 1 CAPSULE BY MOUTH THREE TIMES A DAY 90 capsule 11   sertraline (ZOLOFT) 100 MG tablet Take 100 mg by mouth daily.     topiramate (TOPAMAX) 50 MG tablet Take 1.5 tablets twice daily 90 tablet 11   triamcinolone cream (KENALOG) 0.1 % Apply 1 application topically 2 (two) times daily.      XTAMPZA ER 18 MG C12A Take 1 capsule by mouth 2 (two) times daily.     ZOHYDRO ER 30 MG C12A TAKE ONE CAPSULE BY MOUTH EVERY TWELVE HOURS.  0   EVZIO 2 MG/0.4ML SOAJ INJECT ONE 0.4ML INJECTION INTO OUTER THIGH AS NEEDED FOR SUSPECTED OPIOID OVERDOSE. CALL 911.  0   HYDROcodone-acetaminophen (NORCO) 10-325 MG per tablet Take 2 tablets by mouth daily as needed.      sertraline (ZOLOFT) 50 MG tablet Take 50 mg by mouth daily.   3   No facility-administered medications prior to visit.    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    PAST SURGICAL HISTORY: Past Surgical History:  Procedure Laterality Date   ABDOMINAL HYSTERECTOMY  2012    FAMILY HISTORY: Family History  Problem Relation Age of Onset   Coronary artery disease Father    Prostate cancer Sister    Hypertension Brother    Congestive Heart Failure Mother     SOCIAL HISTORY: Social History   Socioeconomic History   Marital status: Divorced    Spouse name: Not on file   Number of children: 0   Years of education: Not on file   Highest education level: Not on file  Occupational History    Employer: [employer]  Tobacco Use   Smoking status: Every Day    Packs/day: 2.00    Types: Cigarettes   Smokeless tobacco: Never  Substance and Sexual Activity   Alcohol use: Yes    Comment: Consumes beer on the weekends only   Drug use: No   Sexual activity: Not on file  Other Topics Concern   Not on file  Social History Narrative    Not on file   Social Determinants of Health   Financial Resource Strain: Not on file  Food Insecurity: Not on file  Transportation Needs: Not on file  Physical Activity: Not on file  Stress: Not on file  Social Connections: Not on file  Intimate Partner Violence: Not on file   PHYSICAL EXAM  Vitals:   08/26/21 1409  BP: 134/80  Pulse: 83  Weight: 135 lb (61.2 kg)  Height: 5' 6"  (1.676 m)    Body mass index is 21.79 kg/m.  Generalized: Well developed, in no acute distress   Neurological examination  Mentation: Alert oriented to time, place, history taking. Follows all commands speech and language fluent Cranial nerve II-XII: Pupils  were equal round reactive to light. Extraocular movements were full, visual field were full on confrontational test. Facial sensation and strength were normal. Head turning and shoulder shrug  were normal and symmetric. Motor: The motor testing reveals 5 over 5 strength of all 4 extremities. Good symmetric motor tone is noted throughout.  Sensory: Sensory testing is intact to soft touch on all 4 extremities. No evidence of extinction is noted.  Coordination: Cerebellar testing reveals good finger-nose-finger and heel-to-shin bilaterally.  Mild tremor with finger-nose-finger bilaterally.  Mild tremor translated to handwriting and spiral drawl. Gait and station: Gait is normal. Tandem gait is slightly unsteady. Reflexes: Deep tendon reflexes are symmetric and normal bilaterally.   DIAGNOSTIC DATA (LABS, IMAGING, TESTING) - I reviewed patient records, labs, notes, testing and imaging myself where available.  Lab Results  Component Value Date   WBC 10.2 08/20/2010   HGB 15.7 (H) 08/20/2010   HCT 45.7 08/20/2010   MCV 94.4 08/20/2010   PLT 195 08/20/2010      Component Value Date/Time   NA 140 08/20/2010 0903   K 4.4 08/20/2010 0903   CL 106 08/20/2010 0903   CO2 23 08/20/2010 0903   GLUCOSE 120 (H) 08/20/2010 0903   BUN 12 08/20/2010  0903   CREATININE 0.80 08/20/2010 0903   CALCIUM 9.6 08/20/2010 0903   ALBUMIN 4.7 01/19/2013 1318   GFRNONAA >60 08/20/2010 0903   GFRAA  08/20/2010 0903    >60        The eGFR has been calculated using the MDRD equation. This calculation has not been validated in all clinical situations. eGFR's persistently <60 mL/min signify possible Chronic Kidney Disease.   No results found for: CHOL, HDL, LDLCALC, LDLDIRECT, TRIG, CHOLHDL No results found for: HGBA1C No results found for: VITAMINB12 No results found for: TSH  ASSESSMENT AND PLAN 61 y.o. year old female  has a past medical history of Abnormal brain MRI (07/26/2013), Anxiety, Arthritis, Atypical facial pain, Chronic back pain, Foot fracture, right, and RSD upper limb (03/07/2014). here with:  1.  Atypical facial pain, left maxillary area 2.  Reflex sympathetic dystrophy, right hand 3.  Abnormal MRI of the brain 4.  Tremor to hands, seems consistent with essential tremor  -Pain is overall well controlled on Topamax, gabapentin, also goes to pain clinic is on chronic hydrocodone complex regional pain syndrome to the right hand -Will request labs from PCP specifically TSH given reported tremor; seems consistent with essential tremor, encouraged to make sure eating consistent meals  -Discussed repeating MRI of the brain, last was in 2019, moderate level white matter changes, there was no evidence of demyelinating disease, stable from 2015; she wishes to wait until next visit  -Follow-up in 1 year or sooner if needed, will now be followed by Dr. Krista Blue since Dr. Jannifer Franklin retired   Butler Denmark, AGNP-C, [place] 08/26/2021, 2:19 PM [facility] [address] [place], [place] [address] [phone]

## 2021-08-26 NOTE — Telephone Encounter (Signed)
Can you please get recent blood work from PCP, specifically TSH.  ?

## 2021-08-26 NOTE — Telephone Encounter (Signed)
Request made today  

## 2022-03-16 ENCOUNTER — Telehealth: Payer: Self-pay | Admitting: Neurology

## 2022-03-16 NOTE — Telephone Encounter (Signed)
Pt called and made appointment for facial numbness and hand numbness. Asked nurse [email or web address] if he was okay to schedule because in the past Pt was seen for facial pain.

## 2022-03-22 NOTE — Progress Notes (Unsigned)
PATIENT: [name] DOB: Sep 08, 1960  REASON FOR VISIT: acute visit, numbness to whole body, achy pain  HISTORY FROM: Patient PRIMARY NEUROLOGIST: Dr. Krista Blue   HISTORY OF PRESENT ILLNESS: Today 03/23/22  Ashante is here today for follow-up. Reports since February 27, 2022 feels her literal whole body is numb, she has achy feeling to hands and feet. Most bothersome is achy pain to extremities. She went to see her primary care doctor with same symptoms, Topamax 75 mg twice daily was reduced to 37.5 mg twice daily, stopped Lipitor with no improvement. Still on gabapentin 300 mg twice daily, hydrocodone, oxycodone. Lives in chronic pain from complex regional pain syndrome to right hand. Dropping things. She isn't driving. No headache, vision is fine, b/b are fine. Numbness, tingling to bilateral toes, decreased sensation to mid shin. Feels unsteady. Tremor to both hands, with action. Gets stabs of pain to both hands. Hands are numb but they hurt. From 8/31-9/6 pharmacy late on getting oxycodone to her, she had to stretch it out, was only take 1 a day vs 2.   Reviewed labs PCP 03/10/22 creatinine 1.2, sodium 144, potassium 5.2, AST 34, ALT 89 WBC 6.45, Hgb 13.7, platelets 230, A1c 5.4.  Update 08/26/21 SS: Romie Minus here today for follow up. On Topamax and gabapentin. Well controlled, triggered by rain storms, moisture weather change. Will take extra gabapentin with good benefit. On hydrocodone for right hand pain. Tremor in both hands, not daily. Sometimes in morning holding coffee cup. Mildly translated into handwriting and spiral draw. Still smoking 1.5 packs a day. MRI brain in 2019 was stable from 2015, no evidence of demyelinated disease. Mild to moderate SVD. Has had tremor for at least 2 years. Only eats 1 meal a day at dinner.   Update 08/25/2020 SS: Ms. Gama is a 61 year old with history of left maxillary atypical facial pain.  Has done fairly well with Topamax and gabapentin.  If she has breakthrough  pain, may take an extra gabapentin, often triggered by weather.  MRI of the brain in September 2019 showed no change from 2015, no evidence to suggest demyelinating disease.  She follows with the pain center for complex regional pain syndrome, on hydrocodone.  She developed Covid earlier this year, still has pain to the right lung with coughing.  Is a chronic smoker.  May notice tremor to both hands intermittently with action, mostly using ash tray.  Spiral drawl, handwriting sample is normal.  Here today for evaluation unaccompanied.  Update 05/31/2019 SS: Ms. Coscia is a 61 year old female with history of left maxillary atypical facial pain.  She has responded moderately well to Topamax and gabapentin.  Her pain is fairly well controlled with her current regimen.  If she has breakthrough pain, she may take an extra gabapentin.  She had MRI of the brain in September 2019, showing no change from 2015.  There was no evidence of progression to suggest a demyelinating disease.  She does not have history of hypertension, but the findings are typical for chronic small vessel disease.  She continues to follow with the pain center for right hand pain felt secondary to reflex sympathetic dystrophy. She does report an occasional intermittent tremor, that is positional to her left hand. She indicates her overall health has been good.  She presents today for evaluation.  HISTORY 02/02/2018 Dr. Jannifer Franklin: Ms. Dauphinee is a 61 year old right-handed white female with a history of left maxillary atypical facial pain.  The patient has responded to some degree  with the combination of Topamax and gabapentin.  The patient still has daily pain but is better controlled with this regimen.  The patient tolerates medications fairly well.  She continues to have right hand pain felt secondary to reflex sympathetic dystrophy, she is followed through a pain center for this.  She sleeps fairly well at night but she remains fatigued during the  day, the fatigue has worsened over the last 8 to 10 months.  She in the past has had abnormal findings on MRI of the brain with a moderate level of white matter changes.  Work-up for multiple sclerosis was negative, the patient does not have a history of hypertension.  The patient returns for an evaluation.  REVIEW OF SYSTEMS: Out of a complete 14 system review of symptoms, the patient complains only of the following symptoms, and all other reviewed systems are negative.  See HPI  ALLERGIES: Allergies  Allergen Reactions   Prednisone     HOME MEDICATIONS: Outpatient Medications Prior to Visit  Medication Sig Dispense Refill   calcium carbonate (TUMS - DOSED IN MG ELEMENTAL CALCIUM) 500 MG chewable tablet Chew 1 tablet by mouth daily. OTC PRN     cetirizine (ZYRTEC) 10 MG tablet Take 10 mg by mouth daily as needed for allergies. OTC PRN     fenofibrate (TRICOR) 145 MG tablet Take 145 mg by mouth daily. with food  12   gabapentin (NEURONTIN) 300 MG capsule TAKE 1 CAPSULE BY MOUTH THREE TIMES A DAY 90 capsule 11   sertraline (ZOLOFT) 100 MG tablet Take 100 mg by mouth daily.     topiramate (TOPAMAX) 50 MG tablet Take 1.5 tablets twice daily 90 tablet 11   triamcinolone cream (KENALOG) 0.1 % Apply 1 application topically 2 (two) times daily.      XTAMPZA ER 18 MG C12A Take 1 capsule by mouth 2 (two) times daily.     atorvastatin (LIPITOR) 10 MG tablet Take 10 mg by mouth daily.  3   ZOHYDRO ER 30 MG C12A TAKE ONE CAPSULE BY MOUTH EVERY TWELVE HOURS.  0   No facility-administered medications prior to visit.    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    PAST SURGICAL HISTORY: Past Surgical History:  Procedure Laterality Date   ABDOMINAL HYSTERECTOMY  2012    FAMILY HISTORY: Family History  Problem Relation  Age of Onset   Coronary artery disease Father    Prostate cancer Sister    Hypertension Brother    Congestive Heart Failure Mother     SOCIAL HISTORY: Social History   Socioeconomic History   Marital status: Divorced    Spouse name: Not on file   Number of children: 0   Years of education: Not on file   Highest education level: Not on file  Occupational History    Employer: [employer]  Tobacco Use   Smoking status: Every Day    Packs/day: 2.00    Types: Cigarettes   Smokeless tobacco: Never  Substance and Sexual Activity   Alcohol use: Yes    Comment: Consumes beer on the weekends only   Drug use: No   Sexual activity: Not on file  Other Topics Concern   Not on file  Social History Narrative  Not on file   Social Determinants of Health   Financial Resource Strain: Not on file  Food Insecurity: Not on file  Transportation Needs: Not on file  Physical Activity: Not on file  Stress: Not on file  Social Connections: Not on file  Intimate Partner Violence: Not on file   PHYSICAL EXAM  Vitals:   03/23/22 1546 03/23/22 1623  BP: (!) 146/88   Pulse: (!) 112 (!) 103  Weight: 135 lb (61.2 kg)   Height: _0  (1.676 m)    Body mass index is 21.79 kg/m.  Generalized: Well developed, in no acute distress   Neurological examination  Mentation: Alert oriented to time, place, history taking. Follows all commands speech and language fluent Cranial nerve II-XII: Pupils were equal round reactive to light. Extraocular movements were full, visual field were full on confrontational test. Facial sensation and strength were normal. Head turning and shoulder shrug  were normal and symmetric. Motor: Good strength all extremities, no weakness noted, giveaway weakness with testing, weak squeeze due to pain Sensory: Reports increased soft touch sensation to the left face, arm, leg Coordination: Cerebellar testing reveals good finger-nose-finger and heel-to-shin bilaterally.   Mild tremor with finger-nose-finger bilaterally.   Gait and station: Gait is slightly wide-based, is cautious, tandem gait is unsteady Reflexes: Deep tendon reflexes are symmetric and normal bilaterally.   DIAGNOSTIC DATA (LABS, IMAGING, TESTING) - I reviewed patient records, labs, notes, testing and imaging myself where available.  Lab Results  Component Value Date   WBC 10.2 08/20/2010   HGB 15.7 (H) 08/20/2010   HCT 45.7 08/20/2010   MCV 94.4 08/20/2010   PLT 195 08/20/2010      Component Value Date/Time   NA 140 08/20/2010 0903   K 4.4 08/20/2010 0903   CL 106 08/20/2010 0903   CO2 23 08/20/2010 0903   GLUCOSE 120 (H) 08/20/2010 0903   BUN 12 08/20/2010 0903   CREATININE 0.80 08/20/2010 0903   CALCIUM 9.6 08/20/2010 0903   ALBUMIN 4.7 01/19/2013 1318   GFRNONAA >60 08/20/2010 0903   GFRAA  08/20/2010 0903    >60        The eGFR has been calculated using the MDRD equation. This calculation has not been validated in all clinical situations. eGFR's persistently <60 mL/min signify possible Chronic Kidney Disease.   No results found for: "CHOL", "HDL", "LDLCALC", "LDLDIRECT", "TRIG", "CHOLHDL" No results found for: "HGBA1C" No results found for: "VITAMINB12" No results found for: "TSH"  ASSESSMENT AND PLAN 61 y.o. year old female  has a past medical history of Abnormal brain MRI (07/26/2013), Anxiety, Arthritis, Atypical facial pain, Chronic back pain, Foot fracture, right, and RSD upper limb (03/07/2014). here with:  1.  Atypical facial pain, left maxillary area 2.  Reflex sympathetic dystrophy, right hand 3.  Abnormal MRI of the brain 4.  Tremor to hands, seems consistent with essential tremor 5.  1 month history of full body numbness, achy pain mostly to extremities  -Check MRI of the brain and cervical spine with and without contrast to rule out structural abnormality, acute problem to explain symptoms -Patient is rather poor historian, unclear etiology of  symptoms, symptoms have persisted for about 1 month, occurred around the timeframe of having to extend chronic pain medication due to pharmacy shortage -Check labs TSH, CK, ESR, CRP -Increase Topamax back to 75 mg twice daily, also on gabapentin 300 mg twice a day, hydrocodone, oxycodone -Go to ER for any acute symptoms, follow up in  4 months with Dr. Krista Blue   MRI of the brain September 2019 MRI brain (without) demonstrating: - Mild-moderate periventricular and subcortical and pontine T2 hyperintensities, likely chronic small vessel ischemic disease. - No acute findings. - No change from MRI on 08/27/13.   Butler Denmark, AGNP-C, DNP 03/23/2022, 4:30 PM [facility] [address] [place], [place] [address] [phone]

## 2022-03-23 ENCOUNTER — Ambulatory Visit: Payer: 59 | Admitting: Neurology

## 2022-03-23 VITALS — BP 146/88 | HR 103 | Ht 66.0 in | Wt 135.0 lb

## 2022-03-23 DIAGNOSIS — R202 Paresthesia of skin: Secondary | ICD-10-CM

## 2022-03-23 DIAGNOSIS — R200 Anesthesia of skin: Secondary | ICD-10-CM | POA: Diagnosis not present

## 2022-03-23 DIAGNOSIS — R9089 Other abnormal findings on diagnostic imaging of central nervous system: Secondary | ICD-10-CM

## 2022-03-23 DIAGNOSIS — G501 Atypical facial pain: Secondary | ICD-10-CM | POA: Diagnosis not present

## 2022-03-23 NOTE — Patient Instructions (Signed)
Increase Topamax back to 75 mg twice daily Check MRI of the brain and cervical spine Check labs today  See you back in 4 months

## 2022-03-24 ENCOUNTER — Telehealth: Payer: Self-pay | Admitting: Neurology

## 2022-03-24 DIAGNOSIS — R202 Paresthesia of skin: Secondary | ICD-10-CM | POA: Insufficient documentation

## 2022-03-24 DIAGNOSIS — R9089 Other abnormal findings on diagnostic imaging of central nervous system: Secondary | ICD-10-CM

## 2022-03-24 LAB — CREATININE, SERUM
Creatinine, Ser: 1.31 mg/dL — ABNORMAL HIGH (ref 0.57–1.00)
eGFR: 47 mL/min/{1.73_m2} — ABNORMAL LOW (ref >59)

## 2022-03-24 LAB — C-REACTIVE PROTEIN: CRP: 1 mg/L (ref 0–10)

## 2022-03-24 LAB — CK: Total CK: 504 U/L (ref 32–182)

## 2022-03-24 LAB — SEDIMENTATION RATE: Sed Rate: 2 mm/hr (ref 0–40)

## 2022-03-24 LAB — TSH: TSH: 1.92 u[IU]/mL (ref 0.450–4.500)

## 2022-03-24 NOTE — Telephone Encounter (Signed)
I spoke with the patient and informed her of the test results. She is agreeable to coming for repeat labs in 2-3 weeks. She verbalized understanding and expressed appreciation for the call. All questions answered.

## 2022-03-24 NOTE — Telephone Encounter (Signed)
Received this message for MRI cervical spine wo; brain was approved.  Based on the clinical information provided, the request has not demonstrated consistency with evidence-based clinical guidelines.  A Physician-to-Physician discussion is required to complete the Notification Process. The ordering provider must call [phone] and select option #3 to engage in a Physician-to-Physician discussion. Please ensure the physician has the case number provided when making this call. If a Physician-to-Physician discussion is not completed within 3 business days, the notification number request will be deemed expired and you must reinitiate the Notification Process.  Case Number: [id number]

## 2022-03-24 NOTE — Telephone Encounter (Signed)
Please call patient, labs showed elevated CK yesterday 504 in the setting of slightly increased creatinine 1.31.  Reviewed labs with Dr. Krista Blue, recommends rechecking in 2-3 weeks. I will send myself a reminder. I will change MRI to without contrast.   Orders Placed This Encounter  Procedures   MR BRAIN WO CONTRAST   MR CERVICAL SPINE WO CONTRAST

## 2022-03-25 ENCOUNTER — Telehealth: Payer: Self-pay | Admitting: Neurology

## 2022-03-25 NOTE — Telephone Encounter (Signed)
Patient needs a break between scans 90 mins MR brain wo & MR cervical wo [place] [phone] exp. 03/25/22-05/08/22 scheduled at [facility] 03/30/22 at 2pm  Asking for anxiety medication please

## 2022-03-25 NOTE — Addendum Note (Signed)
Addended by: Verlin Grills on: 03/25/2022 04:58 PM   Modules accepted: Orders

## 2022-03-28 MED ORDER — ALPRAZOLAM 0.5 MG PO TABS: ORAL_TABLET | ORAL | 0 refills | Status: DC

## 2022-03-28 NOTE — Addendum Note (Signed)
Addended by: Suzzanne Cloud on: 03/28/2022 10:20 PM   Modules accepted: Orders

## 2022-03-28 NOTE — Telephone Encounter (Signed)
Meds ordered this encounter  Medications   ALPRAZolam (XANAX) 0.5 MG tablet    Sig: Take 1-2 tablets prior to MRI 1 additional tablet can be used as the scan starts if needed. MUST HAVE DRIVER    Dispense:  3 tablet    Refill:  0

## 2022-03-30 ENCOUNTER — Ambulatory Visit: Payer: 59

## 2022-03-30 DIAGNOSIS — R9089 Other abnormal findings on diagnostic imaging of central nervous system: Secondary | ICD-10-CM | POA: Diagnosis not present

## 2022-03-31 ENCOUNTER — Other Ambulatory Visit (INDEPENDENT_AMBULATORY_CARE_PROVIDER_SITE_OTHER): Payer: 59

## 2022-03-31 ENCOUNTER — Other Ambulatory Visit: Payer: Self-pay | Admitting: Neurology

## 2022-03-31 ENCOUNTER — Other Ambulatory Visit: Payer: Self-pay

## 2022-03-31 ENCOUNTER — Telehealth: Payer: Self-pay | Admitting: Neurology

## 2022-03-31 DIAGNOSIS — R42 Dizziness and giddiness: Secondary | ICD-10-CM

## 2022-03-31 DIAGNOSIS — R9089 Other abnormal findings on diagnostic imaging of central nervous system: Secondary | ICD-10-CM

## 2022-03-31 DIAGNOSIS — R200 Anesthesia of skin: Secondary | ICD-10-CM

## 2022-03-31 DIAGNOSIS — R2681 Unsteadiness on feet: Secondary | ICD-10-CM

## 2022-03-31 DIAGNOSIS — Z0289 Encounter for other administrative examinations: Secondary | ICD-10-CM

## 2022-03-31 DIAGNOSIS — R296 Repeated falls: Secondary | ICD-10-CM

## 2022-03-31 DIAGNOSIS — I639 Cerebral infarction, unspecified: Secondary | ICD-10-CM

## 2022-03-31 NOTE — Telephone Encounter (Signed)
I called the patient, she was still asleep, I talked with her sister who lives with her.  Indicates still having  trouble holding objects, achy pain to her hands and feet, has had a few falls.  I mention possibility of needing to go to the hospital if her condition worsens, her sister is a Marine scientist, feels she is okay at home now.  She had her MRIs yesterday, is tired from this. I reviewed MRIs with Dr. Krista Blue.  MRI of the brain yesterday showed a new remote right occipital lobe infarct, that was not seen on prior MRI in 2019.  She has had mild progression of age advanced small vessel disease.  MRI cervical spine shows severe left-sided foraminal narrowing at C4-5 and C5-6 and moderate right-sided foraminal narrowing at C5-6.  I will order hypercoagulable labs, cardiac monitor, echo, CTA head and neck.  Last week CK level was 504, I ordered a recheck.  Unclear etiology of symptoms, I've asked her to come today for labs, will get her an appointment next week with Dr. Krista Blue. Go to the ER for any worsening of condition.  Orders Placed This Encounter  Procedures   CT ANGIO HEAD W OR WO CONTRAST   CT ANGIO NECK W OR WO CONTRAST   MR THORACIC SPINE WO CONTRAST   ANA w/Reflex   Factor 5 leiden   Lupus anticoagulant   Cardiolipin antibodies, IgG, IgM, IgA   Beta-2-glycoprotein i abs, IgG/M/A   Homocysteine   Vitamin B12   CK   CMP   Ambulatory referral to Neurology   CARDIAC EVENT MONITOR   ECHOCARDIOGRAM COMPLETE

## 2022-04-01 ENCOUNTER — Telehealth: Payer: Self-pay | Admitting: Neurology

## 2022-04-01 NOTE — Telephone Encounter (Signed)
MRI thoracic spine wo contrast scheduled at [facility] for 04/13/22 at 3:30 pm.   [facility] auth: [id number] (04/01/22-05/16/22)

## 2022-04-01 NOTE — Telephone Encounter (Signed)
CTA head: [employer] auth: [id number] exp. 04/01/22-05/16/22 CTA neck: [employer] Josem Kaufmann: [id number] exp. 04/01/22-05/16/22 Sent to GI [phone]

## 2022-04-05 ENCOUNTER — Encounter: Payer: Self-pay | Admitting: Neurology

## 2022-04-05 ENCOUNTER — Ambulatory Visit: Payer: 59 | Admitting: Neurology

## 2022-04-05 VITALS — BP 160/101 | HR 120 | Ht 66.0 in | Wt 125.0 lb

## 2022-04-05 DIAGNOSIS — R269 Unspecified abnormalities of gait and mobility: Secondary | ICD-10-CM | POA: Diagnosis not present

## 2022-04-05 DIAGNOSIS — I639 Cerebral infarction, unspecified: Secondary | ICD-10-CM | POA: Diagnosis not present

## 2022-04-05 DIAGNOSIS — R202 Paresthesia of skin: Secondary | ICD-10-CM

## 2022-04-05 DIAGNOSIS — I679 Cerebrovascular disease, unspecified: Secondary | ICD-10-CM | POA: Insufficient documentation

## 2022-04-05 LAB — LUPUS ANTICOAGULANT
Dilute Viper Venom Time: 31.4 s (ref 0.0–47.0)
PTT Lupus Anticoagulant: 30 s (ref 0.0–43.5)
Thrombin Time: 19.5 s (ref 0.0–23.0)
dPT Confirm Ratio: 1.02 Ratio (ref 0.00–1.34)
dPT: 34 s (ref 0.0–47.6)

## 2022-04-05 LAB — COMPREHENSIVE METABOLIC PANEL
ALT: 41 IU/L — ABNORMAL HIGH (ref 0–32)
AST: 33 IU/L (ref 0–40)
Albumin/Globulin Ratio: 1.8 (ref 1.2–2.2)
Albumin: 4.3 g/dL (ref 3.8–4.9)
Alkaline Phosphatase: 58 IU/L (ref 44–121)
BUN/Creatinine Ratio: 9 — ABNORMAL LOW (ref 12–28)
BUN: 11 mg/dL (ref 8–27)
Bilirubin Total: 0.4 mg/dL (ref 0.0–1.2)
CO2: 21 mmol/L (ref 20–29)
Calcium: 9.7 mg/dL (ref 8.7–10.3)
Chloride: 103 mmol/L (ref 96–106)
Creatinine, Ser: 1.24 mg/dL — ABNORMAL HIGH (ref 0.57–1.00)
Globulin, Total: 2.4 g/dL (ref 1.5–4.5)
Glucose: 140 mg/dL — ABNORMAL HIGH (ref 70–99)
Potassium: 4.5 mmol/L (ref 3.5–5.2)
Sodium: 140 mmol/L (ref 134–144)
Total Protein: 6.7 g/dL (ref 6.0–8.5)
eGFR: 50 mL/min/{1.73_m2} — ABNORMAL LOW (ref >59)

## 2022-04-05 LAB — ANA W/REFLEX: Anti Nuclear Antibody (ANA): NEGATIVE

## 2022-04-05 LAB — FACTOR 5 LEIDEN

## 2022-04-05 LAB — BETA-2-GLYCOPROTEIN I ABS, IGG/M/A
Beta-2 Glyco 1 IgA: 10 GPI IgA units (ref 0–25)
Beta-2 Glyco 1 IgM: <9 GPI IgM units (ref 0–32)
Beta-2 Glyco I IgG: <9 GPI IgG units (ref 0–20)

## 2022-04-05 LAB — CARDIOLIPIN ANTIBODIES, IGG, IGM, IGA
Anticardiolipin IgA: <9 APL U/mL (ref 0–11)
Anticardiolipin IgG: <9 GPL U/mL (ref 0–14)
Anticardiolipin IgM: <9 MPL U/mL (ref 0–12)

## 2022-04-05 LAB — VITAMIN B12: Vitamin B-12: >2000 pg/mL — ABNORMAL HIGH (ref 232–1245)

## 2022-04-05 LAB — HOMOCYSTEINE: Homocysteine: 27.6 umol/L — ABNORMAL HIGH (ref 0.0–14.5)

## 2022-04-05 LAB — CK: Total CK: 302 U/L — ABNORMAL HIGH (ref 32–182)

## 2022-04-05 MED ORDER — OXCARBAZEPINE 150 MG PO TABS: 150.0000 mg | ORAL_TABLET | Freq: Two times a day (BID) | ORAL | 6 refills | Status: DC

## 2022-04-05 NOTE — Progress Notes (Signed)
Chief Complaint  Patient presents with   New Patient (Initial Visit)    Rm 15, alone  Referral from [place] to Dr. Krista Blue for new right occipital stroke-prior Dr. Jannifer Franklin patient Today c/o hand and feet pain       ASSESSMENT AND PLAN  [name] is a 61 y.o. female Subacute onset of paresthesia, gait abnormality,  She has a wide-based unsteady gait, positive Romberg signs, absent lower extremity deep tendon reflex,  EMG nerve conduction study for possible demyelinating neuropathy,  Complete laboratory evaluation  Suboptimal control of neuropathic pain, taking gabapentin, multiple dose of narcotics, add on Trileptal 150 mg twice a day, stop Topamax to avoid polypharmacy Cerebrovascular disease  Right occipital stroke new since 2019, moderate to slow progressive periventricular small vessel disease  She does has vascular risk factor of heavy smoker, hyperlipidemia    CT angiogram head and neck, echocardiogram is pending, aspirin 81 mg daily increase water intake   DIAGNOSTIC DATA (LABS, IMAGING, TESTING) - I reviewed patient records, labs, notes, testing and imaging myself where available.  Labs in Oct 2023, normal or negative ANA, hypercoagulation panel, elevated homocystine 27.6, B12 level was more than 2000, creatinine 1.24, CPK 302, C-reactive protein, ESR, TSH,  MEDICAL HISTORY:  [name] was patient of Dr. Jannifer Franklin, recently followed up by Judson Roch, primary care physician is Dr., Osborne Casco, Fransico Him  I reviewed and summarized the referring note. PMHX. Complex regional pain syndrome, is under the care of pain management, taking Norco 10/325 tid , Extended release oxycodone 101m bid Depression,  Left facial pain,  HLD Smoke 2ppd, she could not hold cigarette, now she is down to 11pd.  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 WJannifer Franklinsince July 2014 for left maxillary area pain, this happened after abscess tubes, multiple dental procedures, and his root canal involving left upper tooth, she was put on gabapentin 300 mg 3 times daily and Topamax 50 mg 1 and half tablets twice a day  Since retirement she has been very active, enjoying deer hunting, last hunting was in fall of 2022.  End of August 2023, woke up 1 morning she noticed numbness at the fingertip, sensitivity, also involving bilateral feet, symptoms gradually getting worse, now her fingers are so sensitive, is hard for her to use her hands, also complains of a sending paresthesia to knee level now, unsteady gait, feel weak all over, has quit driving because she could not feel the brake and the wheel  She denies bowel and bladder incontinence,  Long history of gradual onset bilateral hands tremor, getting worse since her subacute onset ascending paresthesia gait abnormality  Personally reviewed MRI of the brain without contrast March 30, 2022, extensive periventricular small vessel disease, which has been present since 2015, slow progression, but new right occipital stroke since last MRI scan in September 2019  MRI of cervical spine showed multilevel degenerative changes no canal stenosis, severe left foraminal narrowing at C4-5, C5-6  Laboratory evaluations normal negative ANA, hypercoagulable panel, elevated homocystine, mild  abnormal creatinine 1.24, CPK mild elevation 302, normal B12, ESR, C-reactive protein, TSH  She had lumbar puncture in August 2014 for evaluation of her extensive periventricular white matter changes, spinal fluid testing showed no significant abnormality, normal  protein, 0 oligoclonal banding   PHYSICAL EXAM:   Vitals:   04/05/22 1302  BP: (!) 160/101  Pulse: (!) 120  Weight: 125 lb (56.7 kg)  Height: 5' 6" (1.676 m)   Not recorded     Body mass index is 20.18 kg/m.  PHYSICAL EXAMNIATION:  Gen: NAD, conversant, well nourised, well groomed                     Cardiovascular: Regular rate rhythm, no peripheral edema, warm, nontender. Eyes: Conjunctivae clear without exudates or hemorrhage Neck: Supple, no carotid bruits. Pulmonary: Clear to auscultation bilaterally   NEUROLOGICAL EXAM:  MENTAL STATUS: Speech/cognition: Depressed looking middle-age female, CRANIAL NERVES: CN II: Visual fields are full to confrontation. Pupils are round equal and briskly reactive to light. CN III, IV, VI: extraocular movement are normal. No ptosis. CN V: Facial sensation is intact to light touch CN VII: Face is symmetric with normal eye closure  CN VIII: Hearing is normal to causal conversation. CN IX, X: Phonation is normal. CN XI: Head turning and shoulder shrug are intact  MOTOR: Bilateral hand postural tremor, no significant weakness,  REFLEXES: Reflexes are 2  and symmetric at the biceps, triceps, absent at knees, and ankles. Plantar responses are flexor.  SENSORY: Decreased light touch, pinprick to ankle level, preserved finger and toe proprioception  COORDINATION: There is no trunk or limb dysmetria noted.  GAIT/STANCE: Need push-up to get up from sitting position, wide-based, unsteady, could not do tandem walking, positive Romberg signs  REVIEW OF SYSTEMS:  Full 14 system review of systems performed and notable only for as above All other review of systems were negative.   ALLERGIES: Allergies  Allergen Reactions   Prednisone     HOME MEDICATIONS: Current Outpatient Medications  Medication Sig Dispense Refill   ALPRAZolam (XANAX) 0.5 MG tablet Take 1-2 tablets prior to MRI 1 additional tablet can be used as the scan  starts if needed. MUST HAVE DRIVER 3 tablet 0   calcium carbonate (TUMS - DOSED IN MG ELEMENTAL CALCIUM) 500 MG chewable tablet Chew 1 tablet by mouth daily. OTC PRN     cetirizine (ZYRTEC) 10 MG tablet Take 10 mg by mouth daily as needed for allergies. OTC PRN     fenofibrate (TRICOR) 145 MG tablet Take 145 mg by mouth daily. with food  12   gabapentin (NEURONTIN) 300 MG capsule TAKE 1 CAPSULE BY MOUTH THREE TIMES A DAY 90 capsule 11   HYDROcodone-acetaminophen (NORCO) 10-325 MG tablet SMARTSIG:1 Tablet(s) By Mouth 1-3 Times Daily PRN     sertraline (ZOLOFT) 100 MG tablet Take 100 mg by mouth daily.     topiramate (TOPAMAX) 50 MG tablet Take 1.5 tablets twice daily 90 tablet 11   triamcinolone cream (KENALOG) 0.1 % Apply 1 application topically 2 (two) times daily.      XTAMPZA ER 18 MG C12A Take 1 capsule by mouth 2 (two) times daily.     No current facility-administered medications for this visit.    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    PAST SURGICAL HISTORY: Past Surgical History:  Procedure Laterality Date   ABDOMINAL HYSTERECTOMY  2012    FAMILY HISTORY: Family History  Problem Relation Age of Onset   Coronary artery disease Father    Prostate cancer Sister    Hypertension Brother    Congestive Heart Failure Mother     SOCIAL HISTORY: Social History   Socioeconomic History   Marital status: Divorced    Spouse name: Not on file   Number of children: 0   Years of education: Not on file   Highest education level: Not on file  Occupational History    Employer: [employer]  Tobacco Use   Smoking status: Every Day    Packs/day: 2.00    Types: Cigarettes   Smokeless tobacco: Never  Substance and Sexual Activity   Alcohol use: Yes    Comment: Consumes beer on the weekends only    Drug use: No   Sexual activity: Not on file  Other Topics Concern   Not on file  Social History Narrative   Not on file   Social Determinants of Health   Financial Resource Strain: Not on file  Food Insecurity: Not on file  Transportation Needs: Not on file  Physical Activity: Not on file  Stress: Not on file  Social Connections: Not on file  Intimate Partner Violence: Not on file      Marcial Pacas, M.D. Ph.D.  [facility] [address], [place], [place] [address] Ph: [phone] Fax: [phone]  CC:  Tisovec, Fransico Him, MD [place],  [place] [address]  Tisovec, Fransico Him, MD     Total time spent reviewing the chart, obtaining history, examined patient, ordering tests, documentation, consultations and family, care coordination was  16 MINUTES

## 2022-04-07 ENCOUNTER — Ambulatory Visit: Payer: 59 | Admitting: Neurology

## 2022-04-07 DIAGNOSIS — I6389 Other cerebral infarction: Secondary | ICD-10-CM | POA: Diagnosis not present

## 2022-04-07 DIAGNOSIS — R269 Unspecified abnormalities of gait and mobility: Secondary | ICD-10-CM

## 2022-04-07 DIAGNOSIS — I639 Cerebral infarction, unspecified: Secondary | ICD-10-CM

## 2022-04-07 DIAGNOSIS — G549 Nerve root and plexus disorder, unspecified: Secondary | ICD-10-CM

## 2022-04-07 DIAGNOSIS — R202 Paresthesia of skin: Secondary | ICD-10-CM

## 2022-04-07 MED ORDER — PREDNISONE 20 MG PO TABS: ORAL_TABLET | ORAL | 3 refills | Status: DC

## 2022-04-07 NOTE — Procedures (Signed)
Full Name: [name] Gender: Female MRN #: [id number] Date of Birth: 05-13-1961    Visit Date: 04/07/2022 16:26 Age: 61 Years Examining Physician: Marcial Pacas Referring Physician: Marcial Pacas Height: 5 feet 6 inch History: 61 year old female longtime smoker, presenting with acute onset of 4 limbs paresthesia, gait abnormality  Summary of the test,  Nerve conduction study:  Left median, ulnar, sural, superficial peroneal sensory responses were absent.  Bilateral median, ulnar antidromic sensory response was absent. Left tibial motor response showed well-preserved distal latency, CMAP amplitude, F-wave latency with mild slow conduction velocity.  Left ulnar, median motor responses were normal.  Electromyography:  Selected needle examination was performed at left lower extremity muscles, left lumbosacral paraspinal muscles; left upper extremity muscles and left cervical paraspinal muscles.  There is evidence of increased insertional activity, polyspike motor unit potential at lower lumbar and cervical paraspinal muscles.  Conclusion: This is an abnormal study.  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    ------------------------------- Marcial Pacas, M.D.Ph.D.  [facility] [address] [place], [place] [address] Tel: [phone] Fax: [phone]  Verbal informed consent was obtained from the patient, patient was informed of potential risk of procedure, including bruising, bleeding, hematoma formation, infection, muscle weakness, muscle pain, numbness, among others.        [place]    Nerve / Sites Muscle Latency Ref. Amplitude Ref. Rel Amp Segments Distance Velocity Ref. Area    ms ms mV mV %  cm m/s m/s mVms  L Median - APB     Wrist APB 3.1 ?4.4 9.2 ?4.0 100 Wrist - APB 7   25.7     Upper arm APB 6.8  8.4  90.7 Upper arm - Wrist 21 57 ?49 25.4  L Ulnar - ADM     Wrist ADM 2.5 ?3.3 8.0 ?6.0 100 Wrist - ADM 7   27.3      B.Elbow ADM 5.0  7.7  97.3 B.Elbow - Wrist 14 57 ?49 26.6     A.Elbow ADM 7.6  7.2  93.2 A.Elbow - B.Elbow 14 53 ?49 23.3  L Tibial - AH     Ankle AH 4.4 ?5.8 11.7 ?4.0 100 Ankle - AH 9   25.4     Pop fossa AH 14.3  5.8  49.8 Pop fossa - Ankle 37 37 ?41 15.2           SNC    Nerve / Sites Rec. Site Peak Lat Ref.  Amp Ref. Segments Distance Peak Diff Ref.    ms ms V V  cm ms ms  L Median - Digit III (Antidromic)     Wrist Dig III NR ?3.6 NR ?15 Wrist - Dig III 14    R Median - Digit III (Antidromic)     Wrist Dig III NR ?3.6 NR ?15 Wrist - Dig III 14    L Ulnar - Digit V (Antidromic)     Wrist Dig V NR ?3.1 NR ?17 Wrist - Dig V 11    L Sural - Ankle (Calf)     Calf Ankle NR ?4.4 NR ?6 Calf - Ankle 14    L Superficial peroneal - Ankle     Lat leg Ankle NR ?4.4 NR ?6 Lat leg - Ankle 14    L Median, Ulnar - Transcarpal comparison     Median Palm Wrist 2.2 ?2.2 4 ?35 Median Palm - Wrist 8  Ulnar Palm Wrist 1.9 ?2.2 10 ?12 Ulnar Palm - Wrist 8          Median Palm - Ulnar Palm  0.3 ?0.4  L Ulnar - Orthodromic, (Dig V, Mid palm)     Dig V Wrist NR ?3.1 NR ?5 Dig V - Wrist 7                       F  Wave    Nerve F Lat Ref.   ms ms  L Ulnar - ADM 27.8 ?32.0  L Tibial - AH 52.4 ?56.0         EMG Summary Table    Spontaneous MUAP Recruitment  Muscle IA Fib PSW Fasc Other Amp Dur. Poly Pattern  L. Tibialis anterior Normal None None None _______ Normal Normal Normal Normal  L. Tibialis posterior Normal None None None _______ Normal Normal Normal Normal  L. Peroneus longus Normal None None None _______ Normal Normal Normal Normal  L. Gastrocnemius (Medial head) Normal None None None _______ Normal Normal Normal Normal  L. Vastus lateralis Normal None None None _______ Normal Normal Normal Normal  L. Lumbar paraspinals (low) Increased 2+ 2+ None _______ Normal Normal Normal Normal  L. Lumbar paraspinals (mid) Increased 1+ 1+ None _______ Normal Normal Normal Normal  L. First  dorsal interosseous Normal None None None _______ Normal Normal Normal Normal  L. Biceps brachii Normal None None None _______ Normal Normal Normal Normal  L. Deltoid Normal None None None _______ Normal Normal Normal Normal  L. Triceps brachii Normal None None None _______ Normal Normal Normal Normal  L. Extensor digitorum communis Normal None None None _______ Normal Normal Normal Normal  L. Cervical paraspinals Increased 1+ None None _______ Normal Normal Normal Normal

## 2022-04-07 NOTE — Progress Notes (Signed)
ASSESSMENT AND PLAN  [name] is a 61 y.o. female Sensory neuronopathy  Confirmed by abnormal nerve conduction EMG April 07, 2022, well-preserved motor conduction study, absent sensory response in all the nerves tested, Presented with subacute onset of paresthesia, gait abnormality since February 26, 2022  She has a wide-based unsteady gait, positive Romberg signs, absent lower extremity deep tendon reflex,  Lab evaluation so far showed no treatable etiology, in specific, no evidence of Sjogren's disease, differentiation diagnosis include paraneoplastic syndrome, inflammatory process vitamin B6 toxicity, she denies history of over supplements of vitamin B6,  Chest x-ray  She does not want to go through more extensive laboratory evaluation such as paraneoplastic panel now,  Fluoroscopy guided lumbar puncture,  Starting prednisone treatment, 60 mg for 2 weeks, 10 mg decrement every 2 weeks, return to clinic in 4 weeks  Neuropathic pain,  Trileptal 150 mg twice a day has made some difference, also on polypharmacy including chronic narcotic treatment   Cerebrovascular disease  Right occipital stroke new since 2019, 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  Labs in Oct 2023, normal or negative ANA, hypercoagulation panel, elevated homocystine 27.6, B12 level was more than 2000, creatinine 1.24, CPK 302, C-reactive protein, ESR, TSH,  MEDICAL HISTORY:  [name] was patient of Dr. Jannifer Franklin, recently followed up by Judson Roch, primary care physician is Dr., Osborne Casco, Fransico Him  I reviewed and summarized the referring note. PMHX. Complex  regional pain syndrome, is under the care of pain management, taking Norco 10/325 tid , Extended release oxycodone 85m bid Depression,  Left facial pain,  HLD Smoke 2ppd, she could not hold cigarette, now she is down to 11pd.  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 WJannifer Franklinsince July 2014 for left maxillary area pain, this happened after abscess tubes, multiple dental procedures, and his root canal involving left upper tooth, she was put on gabapentin 300 mg 3 times daily and Topamax 50 mg 1 and half tablets twice a day  Since retirement she has been very active, enjoying deer hunting, last hunting was in fall of 2022.  End of August 2023, woke up 1 morning she noticed numbness at the fingertip, sensitivity, also involving bilateral feet, symptoms gradually getting worse, now her fingers are so sensitive, is hard for her to use her hands, also complains of a sending paresthesia to knee level now, unsteady gait, feel weak all over, has quit driving because she could not feel the brake and the wheel  She denies bowel and bladder incontinence,  Long history of gradual onset bilateral hands tremor, getting worse since her subacute onset ascending paresthesia gait abnormality  Personally reviewed MRI of the brain without contrast March 30, 2022, extensive periventricular small vessel disease, which has been present since 2015, slow progression, but new right occipital stroke since last MRI scan in September 2019  MRI of cervical spine showed multilevel degenerative changes no  canal stenosis, severe left foraminal narrowing at C4-5, C5-6  Laboratory evaluations normal negative ANA, hypercoagulable panel, elevated homocystine, mild abnormal creatinine 1.24, CPK mild elevation 302, normal B12, ESR, C-reactive protein, TSH  She had lumbar puncture in August 2014 for evaluation of her extensive periventricular white matter changes, spinal fluid testing showed no significant abnormality, normal protein, 0 oligoclonal banding  Update April 07, 2022 Patient return for electrodiagnostic study today, which showed sensory neuropathy, well-preserved motor response, but absent sensory response all the nerves tested, Adding on Trileptal has eased up her foot pain to some degree, she can walk better, still have sensitivity of her finger, needle prick with light touch at the fingertips, burning sensation  Extensive laboratory evaluation showed normal negative HIV, INR, B12, Lyme titer, heavy metal, B12 alcohol, A1c was mildly elevated 5.9, hypercoagulable status was negative,  She has been a heavy smoker for many years, confirmed with patient the history, currently February 26, 2022, she function well, helped her neighbor cut a big tree limb 2 weeks  weeks prior to the incident, she was able to climb up a ladder to clean her gutter in August 2023 fairly acute onset of paresthesia evolving bilateral hands and feet walking up 1 morning, gait abnormality   PHYSICAL EXAM: 131/89 heart rate of 74  PHYSICAL EXAMNIATION:  Gen: NAD, conversant, well nourised, well groomed       NEUROLOGICAL EXAM:  MENTAL STATUS: Speech/cognition: Depressed looking middle-age female, CRANIAL NERVES: CN II: Visual fields are full to confrontation. Pupils are round equal and briskly reactive to light. CN III, IV, VI: extraocular movement are normal. No ptosis. CN V: Facial sensation is intact to light touch CN VII: Face is symmetric with normal eye closure  CN VIII: Hearing is normal to causal  conversation. CN IX, X: Phonation is normal. CN XI: Head turning and shoulder shrug are intact  MOTOR: Bilateral hand postural tremor, no significant weakness, mild bilateral toe extension flexion weakness, also limited due to toe pain,  REFLEXES: Reflexes are 2  and symmetric at the biceps, triceps, absent at knees, and ankles. Plantar responses are flexor.  SENSORY: Reduced vibratory sensation to mid shin level, decreased toe proprioception, pinprick to knee  Decreased finger vibratory sensation, and proprioception, decreased pinprick to mid forearm level   COORDINATION: There is no trunk or limb dysmetria noted.  GAIT/STANCE: Need push-up to get up from sitting position, wide-based, unsteady, could not do tandem walking, positive Romberg signs  REVIEW OF SYSTEMS:  Full 14 system review of systems performed and notable only for as above All other review of systems were negative.   ALLERGIES: Allergies  Allergen Reactions   Prednisone     HOME MEDICATIONS: Current Outpatient Medications  Medication Sig Dispense Refill   ALPRAZolam (XANAX) 0.5 MG tablet Take 1-2 tablets prior to MRI 1 additional tablet can be used as the scan starts if needed. MUST HAVE DRIVER 3 tablet 0   calcium carbonate (TUMS - DOSED IN MG ELEMENTAL CALCIUM) 500 MG chewable tablet Chew 1 tablet by mouth daily. OTC PRN     cetirizine (ZYRTEC) 10 MG tablet Take 10 mg by mouth daily as needed for allergies. OTC PRN     fenofibrate (TRICOR) 145 MG tablet Take 145 mg by mouth daily.  with food  12   gabapentin (NEURONTIN) 300 MG capsule TAKE 1 CAPSULE BY MOUTH THREE TIMES A DAY 90 capsule 11   HYDROcodone-acetaminophen (NORCO) 10-325 MG tablet SMARTSIG:1 Tablet(s) By Mouth 1-3 Times Daily PRN     OXcarbazepine (TRILEPTAL) 150 MG tablet Take 1 tablet (150 mg total) by mouth 2 (two) times daily. 60 tablet 6   sertraline (ZOLOFT) 100 MG tablet Take 100 mg by mouth daily.     triamcinolone cream (KENALOG) 0.1 %  Apply 1 application topically 2 (two) times daily.      XTAMPZA ER 18 MG C12A Take 1 capsule by mouth 2 (two) times daily.     No current facility-administered medications for this visit.    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    PAST SURGICAL HISTORY: Past Surgical History:  Procedure Laterality Date   ABDOMINAL HYSTERECTOMY  2012    FAMILY HISTORY: Family History  Problem Relation Age of Onset   Coronary artery disease Father    Prostate cancer Sister    Hypertension Brother    Congestive Heart Failure Mother     SOCIAL HISTORY: Social History   Socioeconomic History   Marital status: Divorced    Spouse name: Not on file   Number of children: 0   Years of education: Not on file   Highest education level: Not on file  Occupational History    Employer: [employer]  Tobacco Use   Smoking status: Every Day    Packs/day: 2.00    Types: Cigarettes   Smokeless tobacco: Never  Substance and Sexual Activity   Alcohol use: Yes    Comment: Consumes beer on the weekends only   Drug use: No   Sexual activity: Not on file  Other Topics Concern   Not on file  Social History Narrative   Not on file   Social Determinants of Health   Financial Resource Strain: Not on file  Food Insecurity: Not on file  Transportation Needs: Not on file  Physical Activity: Not on file  Stress: Not on file  Social Connections: Not on file  Intimate Partner Violence: Not on file      Marcial Pacas, M.D. Ph.D.  [facility] [address] [place], [place] [address] Ph: [phone] Fax: [phone]  CC:  Tisovec, Fransico Him, MD [place],  [place] [address]  Tisovec, Fransico Him, MD     Total time spent reviewing the chart, obtaining history, examined patient,  ordering tests, documentation, consultations and family, care coordination was  50 MINUTES

## 2022-04-11 NOTE — Progress Notes (Signed)
EMG report is under procedure 

## 2022-04-13 ENCOUNTER — Telehealth: Payer: Self-pay | Admitting: Neurology

## 2022-04-13 ENCOUNTER — Ambulatory Visit: Payer: 59

## 2022-04-13 DIAGNOSIS — R202 Paresthesia of skin: Secondary | ICD-10-CM | POA: Diagnosis not present

## 2022-04-13 DIAGNOSIS — R200 Anesthesia of skin: Secondary | ICD-10-CM

## 2022-04-13 DIAGNOSIS — R9089 Other abnormal findings on diagnostic imaging of central nervous system: Secondary | ICD-10-CM

## 2022-04-13 NOTE — Telephone Encounter (Signed)
Pt's sister Charlott Rakes called stating that the pt's ambulatory status has decreased to just moving from the bed to chair and for her to move far she has to use a cane. Also pt took her Prednisone on the 20th and could not sleep all night long and started to having hallucinations of big Moths dead under her sheets. She finally went to bed at 6am she was able to go to bed but not before having 2 falls. She is also having an increase in pain and numbness in hands and feet and is needing assistance with eating and turning on cell phone. Hilda Blades also stated that the pt's Vision has decreased and that the OXcarbazepine (TRILEPTAL) 150 MG tablet is not working. Please advise.

## 2022-04-13 NOTE — Telephone Encounter (Signed)
Please call patient to start lower dose of prednisone,  Starting from prednisone 20 mg 2 tablets =40 mg daily,  Continue tapering down half tablet (10 mg), every 2 weeks,  Keep follow-up appointment with me in November  Please also start prior authorization at [facility] for IVIG 2 g/kg as loading dose (divided into 4 days) followed by 1 g/kg every 3 weeks,

## 2022-04-14 ENCOUNTER — Telehealth: Payer: Self-pay

## 2022-04-14 NOTE — Telephone Encounter (Signed)
-----   Message from Suzzanne Cloud, NP sent at 04/14/2022  7:45 AM EDT ----- Please call, MRI of thoracic spine showed mild degenerative changes, was overall unremarkable. FYI Dr. Krista Blue.  IMPRESSION: Unremarkable MRI scan thoracic spine without contrast showing a only minor disc degenerative changes without compression.

## 2022-04-14 NOTE — Telephone Encounter (Signed)
I spoke with the patient and provided her with results. She verbalized understanding of the findings and expressed appreciation for the call. All questions answered.

## 2022-04-15 NOTE — Telephone Encounter (Signed)
Contacted pt sister back, no answer. Contacted pt,  informed her Dr Krista Blue wants to start lower dose of prednisone, She stated she is not taking that medication at all. She completed stopped it and stated she is done with it and will not be taking it again.   I will speak with [facility] to get IVIG started.

## 2022-04-19 ENCOUNTER — Ambulatory Visit
Admission: RE | Admit: 2022-04-19 | Discharge: 2022-04-19 | Disposition: A | Discharge status: Home / Self Care | Payer: 59 | Source: Ambulatory Visit | Attending: Neurology | Admitting: Neurology

## 2022-04-19 VITALS — BP 122/85 | HR 86

## 2022-04-19 DIAGNOSIS — R269 Unspecified abnormalities of gait and mobility: Secondary | ICD-10-CM

## 2022-04-19 DIAGNOSIS — G549 Nerve root and plexus disorder, unspecified: Secondary | ICD-10-CM

## 2022-04-19 DIAGNOSIS — I639 Cerebral infarction, unspecified: Secondary | ICD-10-CM

## 2022-04-19 NOTE — Telephone Encounter (Signed)
IVIG start giving to [facility] in [facility]

## 2022-04-19 NOTE — Progress Notes (Signed)
1 vial of blood drawn from pts L hand  to be sent off with LP lab work. 1 successful attempt, pt tolerated well. Gauze and tape applied after.

## 2022-04-19 NOTE — Discharge Instructions (Signed)

## 2022-04-21 ENCOUNTER — Telehealth: Payer: Self-pay | Admitting: Neurology

## 2022-04-21 NOTE — Telephone Encounter (Signed)
Please call patient spinal fluid testing showed elevated total protein 61, in line with her diagnosis of sensory neuronopathy,  Checking of her response to lower dose of prednisone, and IVIG scheduled

## 2022-04-22 LAB — HIV ANTIBODY (ROUTINE TESTING W REFLEX): HIV Screen 4th Generation wRfx: NONREACTIVE

## 2022-04-22 LAB — MULTIPLE MYELOMA PANEL, SERUM
Albumin SerPl Elph-Mcnc: 3.7 g/dL (ref 2.9–4.4)
Albumin/Glob SerPl: 1.3 (ref 0.7–1.7)
Alpha 1: 0.3 g/dL (ref 0.0–0.4)
Alpha2 Glob SerPl Elph-Mcnc: 0.7 g/dL (ref 0.4–1.0)
B-Globulin SerPl Elph-Mcnc: 1.2 g/dL (ref 0.7–1.3)
Gamma Glob SerPl Elph-Mcnc: 0.7 g/dL (ref 0.4–1.8)
Globulin, Total: 2.9 g/dL (ref 2.2–3.9)
IgA/Immunoglobulin A, Serum: 249 mg/dL (ref 87–352)
IgG (Immunoglobin G), Serum: 732 mg/dL (ref 586–1602)
IgM (Immunoglobulin M), Srm: 126 mg/dL (ref 26–217)
Total Protein: 6.6 g/dL (ref 6.0–8.5)

## 2022-04-22 LAB — ETHANOL

## 2022-04-22 LAB — COPPER, SERUM: Copper: 123 ug/dL (ref 80–158)

## 2022-04-22 LAB — DRUG SCREEN 8 W/CONF, WB
Amphetamines, IA: NEGATIVE ng/mL
Barbiturates, IA: NEGATIVE ug/mL
Benzodiazepines, IA: NEGATIVE ng/mL
Cocaine/Metabolite, IA: NEGATIVE ng/mL
Opiates, IA: POSITIVE ng/mL — AB
Oxycodones, IA: POSITIVE ng/mL — AB
Phencyclidine, IA: NEGATIVE ng/mL

## 2022-04-22 LAB — LYME DISEASE SEROLOGY W/REFLEX: Lyme Total Antibody EIA: NEGATIVE

## 2022-04-22 LAB — THC,MS,WB/SP RFX
Cannabidiol: NEGATIVE ng/mL
Hydroxy-THC: NEGATIVE ng/mL
Tetrahydrocannabinol(THC): NEGATIVE ng/mL

## 2022-04-22 LAB — OPIATES,MS,WB/SP RFX
6-Acetylmorphine: NEGATIVE
Codeine: NEGATIVE ng/mL
Dihydrocodeine: NEGATIVE ng/mL
Hydrocodone: 33.2 ng/mL
Hydromorphone: NEGATIVE ng/mL
Morphine: NEGATIVE ng/mL
Opiate Confirmation: POSITIVE

## 2022-04-22 LAB — HEAVY METALS, BLOOD
Arsenic: <1 ug/L (ref 0–9)
Lead, Blood: 1.8 ug/dL (ref 0.0–3.4)
Mercury: <1 ug/L (ref 0.0–14.9)

## 2022-04-22 LAB — OXYCODONES,MS,WB/SP RFX
Oxycocone: 27.4 ng/mL
Oxycodones Confirmation: POSITIVE
Oxymorphone: NEGATIVE ng/mL

## 2022-04-22 LAB — RPR: RPR Ser Ql: NONREACTIVE

## 2022-04-22 LAB — CREATININE, SERUM
Creatinine, Ser: 1.32 mg/dL — ABNORMAL HIGH (ref 0.57–1.00)
eGFR: 46 mL/min/{1.73_m2} — ABNORMAL LOW (ref >59)

## 2022-04-22 LAB — HGB A1C W/O EAG: Hgb A1c MFr Bld: 5.9 % — ABNORMAL HIGH (ref 4.8–5.6)

## 2022-04-22 NOTE — Telephone Encounter (Addendum)
I called the pt. She reports she continues to be in pain. She did not start the prednisone as recommended on 04/15/2022 due to possible s/e. She is not adamant she will not take prednisone.    She has not been able to have her chest x-ray    I called the pt back we dicussed message.  Pt reports she has not started IVIG and declines to start this med. She has take prednisone and has not tolerated well. She reported multiple questions/concerns about her pain and ongoing symptoms. She also reports several concerns/questions about starting IVIG ( no start date at present)  I have scheduled a telephone visit with Dr. Krista Blue on 04/28/2022 at 430 to discuss the treatment plan going forward  Pt Contact Information  [phone] (Home Phone)  [phone] [facility])

## 2022-04-22 NOTE — Telephone Encounter (Signed)
I called the pt. She reports she continues to be in pain. She did not start the prednisone as recommended on 04/15/2022 due to possible s/e. She is not adamant she will not take prednisone.   She has not been able to have her chest x-ray

## 2022-04-26 NOTE — Telephone Encounter (Signed)
Contacted pt back to inform her Dr Krista Blue would prefer to do VV. She stated she didn't have a computer to do it with just ipad and phone and has never did it before and didn't know how. I advised her its user friendly, a link/notification would be sent to her to check in, once she is in she would wait in the "lobby' for Dr Krista Blue to sign on. Advised she try to sign on early to make sure she was checked in prior to appt and to give [place] a call if she is having trouble and we can cover back to T.V. She verbally understood and was appreciative.

## 2022-04-28 ENCOUNTER — Other Ambulatory Visit: Payer: Self-pay | Admitting: Neurology

## 2022-04-28 ENCOUNTER — Encounter: Payer: Self-pay | Admitting: Neurology

## 2022-04-28 ENCOUNTER — Ambulatory Visit: Payer: 59 | Admitting: Neurology

## 2022-04-28 ENCOUNTER — Telehealth: Payer: Self-pay | Admitting: Neurology

## 2022-04-28 DIAGNOSIS — R202 Paresthesia of skin: Secondary | ICD-10-CM | POA: Diagnosis not present

## 2022-04-28 DIAGNOSIS — G549 Nerve root and plexus disorder, unspecified: Secondary | ICD-10-CM

## 2022-04-28 DIAGNOSIS — M792 Neuralgia and neuritis, unspecified: Secondary | ICD-10-CM

## 2022-04-28 DIAGNOSIS — H5462 Unqualified visual loss, left eye, normal vision right eye: Secondary | ICD-10-CM | POA: Insufficient documentation

## 2022-04-28 MED ORDER — ALPRAZOLAM 0.5 MG PO TABS: ORAL_TABLET | ORAL | 1 refills | Status: AC

## 2022-04-28 MED ORDER — GABAPENTIN 300 MG PO CAPS: 600.0000 mg | ORAL_CAPSULE | Freq: Three times a day (TID) | ORAL | 11 refills | Status: DC

## 2022-04-28 NOTE — Telephone Encounter (Signed)
Ophthalmology referral sent to [facility], phone # [phone].

## 2022-04-28 NOTE — Telephone Encounter (Signed)
I spoke with [name], she is working on [employer]. This is in process, will fwd to MRI coordinator as well for other imaging studies.

## 2022-04-28 NOTE — Patient Instructions (Addendum)
Prednisone '20mg'$  x2 tabs='40mg'$  daily every morning after breakfast.  Higher dose of gabapentin '300mg'$  2 tabs three times a day Trileptal '150mg'$  twice a day   Xanax 0.'5mg'$  as needed for agitation.   Meds ordered this encounter  Medications   gabapentin (NEURONTIN) 300 MG capsule    Sig: Take 2 capsules (600 mg total) by mouth 3 (three) times daily. TAKE 1 CAPSULE BY MOUTH THREE TIMES A DAY    Dispense:  180 capsule    Refill:  11   ALPRAZolam (XANAX) 0.5 MG tablet    Sig: Take 1-2 tablets prior to MRI 1 additional tablet can be used as the scan starts if needed. MUST HAVE DRIVER    Dispense:  45 tablet    Refill:  1    Do not refill in less than 30 days      Orders Placed This Encounter  Procedures   CT CHEST [place]   Autoimmune Neurology Ab   Ambulatory referral to Ophthalmology

## 2022-04-28 NOTE — Telephone Encounter (Signed)
Called pt to confirm mychart appointment. Pt stated she had an old Ipad that wouldn't connect to [employer]. Pt requested a telephone visit with Dr. Krista Blue. Reached out to nurse Jinny Blossom stated it should be okay to change to telephone visit.

## 2022-04-28 NOTE — Telephone Encounter (Signed)
Make sure she is on schedule for CT of the chest, CT angiogram of head and neck as ordered,  Also check on the progress of her IVIG preauthorization

## 2022-04-28 NOTE — Progress Notes (Addendum)
ASSESSMENT AND PLAN  [name] is a 61 y.o. female Sensory neuronopathy  Confirmed by abnormal nerve conduction EMG April 07, 2022, well-preserved motor conduction study, absent sensory response in all the nerves tested, Presented with subacute onset of paresthesia, gait abnormality since February 26, 2022  She has a wide-based unsteady gait, positive Romberg signs, absent lower extremity deep tendon reflex,  Lab evaluation so far showed no treatable etiology, in specific, no evidence of Sjogren's disease, differentiation diagnosis include paraneoplastic syndrome, inflammatory process vitamin B6 toxicity, she denies history of over supplements of vitamin B6,  Start more extensive evaluation, including CT chest, paraneoplastic panel, she is a longtime smoker  Fluoroscopy guided lumbar puncture in October 2023 showed elevated total protein of 61,  Could not tolerate prednisone 60 mg, complains of agitation, advised her to start 40 mg daily  IVIG preauthorization March 30, 2022, 2 g/kg as loading dose then 1 g/kg every 3 weeks  Anxiety, agitation with high dose of steroid treatment Complex regional pain syndrome, under the care of preferred pain management,  Taking Norco 10/325 up to 3 tablets a day,  Add on low-dose Xanax 0.5 mg 1 to 2 tablets every night as needed    Neuropathic pain,  Trileptal 150 mg twice a day, higher dose of gabapentin 300 mg 2 tablets 3 times a day     Cerebrovascular disease  Right occipital stroke new since 2019, moderate to severe progressive periventricular small vessel disease  She does has vascular risk factor of heavy smoker, hyperlipidemia, moderate alcohol use    CT angiogram head and neck, echocardiogram is pending, aspirin 81 mg daily increase water intake   Worsening left vision,  Refer to ophthalmologist for evaluation  DIAGNOSTIC DATA (LABS, IMAGING, TESTING) - I reviewed patient records, labs, notes, testing and imaging myself where  available.  Addendum: Ophthalmology evaluation by Dr. Nathanial [name] on May 20, 2022, normal macular, good foveal reflex, no disc edema, nuclear sclerosis OU, homonymous bilateral field deficit, left side, from right occipital stroke,  Labs in Oct 2023, normal or negative ANA, hypercoagulation panel, elevated homocystine 27.6, B12 level was more than 2000, creatinine 1.24, CPK 302, C-reactive protein, ESR, TSH,  MEDICAL HISTORY:  [name] was patient of Dr. Jannifer Franklin, recently followed up by [name], primary care physician is Dr., Osborne Casco, [name]  I reviewed and summarized the referring note. PMHX. Complex regional pain syndrome, is under the care of pain management, taking Norco 10/325 tid , Extended release oxycodone 73m bid Depression,  Left facial pain,  HLD Smoke 2ppd, she could not hold cigarette, now she is down to 11pd.  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 WJannifer Franklinsince July 2014 for left maxillary area pain, this happened after abscess tubes, multiple dental procedures, and his root canal involving left upper tooth, she was put on gabapentin 300 mg 3 times daily and Topamax 50 mg 1 and half tablets twice a day  Since retirement she has been very active, enjoying deer hunting, last hunting  was in fall of 2022.  End of August 2023, woke up 1 morning she noticed numbness at the fingertip, sensitivity, also involving bilateral feet, symptoms gradually getting worse, now her fingers are so sensitive, is hard for her to use her hands, also complains of a  sending paresthesia to knee level now, unsteady gait, feel weak all over, has quit driving because she could not feel the brake and the wheel  She denies bowel and bladder incontinence,  Long history of gradual onset bilateral hands tremor, getting worse since her subacute onset ascending paresthesia gait abnormality  Personally reviewed MRI of the brain without contrast March 30, 2022, extensive periventricular small vessel disease, which has been present since 2015, slow progression, but new right occipital stroke since last MRI scan in September 2019  MRI of cervical spine showed multilevel degenerative changes no canal stenosis, severe left foraminal narrowing at C4-5, C5-6  Laboratory evaluations normal negative ANA, hypercoagulable panel, elevated homocystine, mild abnormal creatinine 1.24, CPK mild elevation 302, normal B12, ESR, C-reactive protein, TSH  She had lumbar puncture in August 2014 for evaluation of her extensive periventricular white matter changes, spinal fluid testing showed no significant abnormality, normal protein, 0 oligoclonal banding  Update April 07, 2022 Patient return for electrodiagnostic study today, which showed sensory neuropathy, well-preserved motor response, but absent sensory response all the nerves tested, Adding on Trileptal has eased up her foot pain to some degree, she can walk better, still have sensitivity of her finger, needle prick with light touch at the fingertips, burning sensation  Extensive laboratory evaluation showed normal negative HIV, INR, B12, Lyme titer, heavy metal, B12 alcohol, A1c was mildly elevated 5.9, hypercoagulable status was negative,  She has been a heavy smoker for many years, confirmed with patient the history, currently February 26, 2022, she function well, helped her neighbor cut a big tree limb 2 weeks  weeks prior to the incident, she was able to climb up a ladder to clean her gutter in August 2023 fairly acute onset  of paresthesia evolving bilateral hands and feet walking up 1 morning, gait abnormality   Virtual Visit via phone UPDATE Apr 28 2022  I discussed the limitations of evaluation and management by telemedicine and the availability of in person appointments. The patient expressed understanding and agreed to proceed  Location: Provider: [place] office; Patient: Home with her sister, retired Equities trader  I connected with Sharmon Leyden  on Apr 28 2022 by a PHONE enabled telemedicine application and verified that I am speaking with the correct person using two identifiers.  UPDATED HiSTORY She was started on prednisone 60 mg April 07, 2022, only tried 1 dose, could not tolerated, the same night she has visual hallucination, did not sleep,  Start IVIG preauthorization since April 19, 2022,  Patient complains of significant neuropathic pain, as if her legs are bonded by tapes, unsteady gait, she is on gabapentin 300 mg 3 times a day, tried Trileptal 150 mg once, make her sleepy and drowsy, no longer taking it  She is also on pain medication, taking Norco 10/325 mg up to 3 times a day as needed, under the care of preferred pain management     REVIEW OF SYSTEMS:  Full 14 system review of systems performed and notable only for as above All other review of systems were negative.   ALLERGIES: Allergies  Allergen Reactions   Codeine     Other reaction(s): itching   Prednisone     HOME MEDICATIONS:  Current Outpatient Medications  Medication Sig Dispense Refill   ALPRAZolam (XANAX) 0.5 MG tablet Take 1-2 tablets prior to MRI 1 additional tablet can be used as the scan starts if needed. MUST HAVE DRIVER 3 tablet 0   calcium carbonate (TUMS - DOSED IN MG ELEMENTAL CALCIUM) 500 MG chewable tablet Chew 1 tablet by mouth daily. OTC PRN     cetirizine (ZYRTEC) 10 MG tablet Take 10 mg by mouth daily as needed for allergies. OTC PRN     fenofibrate (TRICOR) 145 MG tablet Take 145 mg by mouth daily.  with food  12   gabapentin (NEURONTIN) 300 MG capsule TAKE 1 CAPSULE BY MOUTH THREE TIMES A DAY 90 capsule 11   HYDROcodone-acetaminophen (NORCO) 10-325 MG tablet SMARTSIG:1 Tablet(s) By Mouth 1-3 Times Daily PRN     OXcarbazepine (TRILEPTAL) 150 MG tablet Take 1 tablet (150 mg total) by mouth 2 (two) times daily. 60 tablet 6   predniSONE (DELTASONE) 20 MG tablet 3 tabs every morning x2 weeks, 2.5 tabs every morning x 2 weeeks 2 tabs every morning x 2 weeks, Stay 1.5 tabs every morning 90 tablet 3   sertraline (ZOLOFT) 100 MG tablet Take 100 mg by mouth daily.     triamcinolone cream (KENALOG) 0.1 % Apply 1 application topically 2 (two) times daily.      XTAMPZA ER 18 MG C12A Take 1 capsule by mouth 2 (two) times daily.     No current facility-administered medications for this visit.    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    PAST SURGICAL HISTORY: Past Surgical History:  Procedure Laterality Date   ABDOMINAL HYSTERECTOMY  2012    FAMILY HISTORY: Family History  Problem Relation Age of Onset   Coronary artery disease Father    Prostate cancer Sister    Hypertension Brother    Congestive Heart Failure Mother     SOCIAL HISTORY: Social History   Socioeconomic History   Marital status: Divorced    Spouse name: Not on file   Number of children: 0   Years of education: Not on file   Highest education level: Not on file  Occupational History    Employer: [employer]  Tobacco Use   Smoking status: Every Day    Packs/day: 2.00    Types: Cigarettes   Smokeless tobacco: Never  Substance and Sexual Activity   Alcohol use: Yes    Comment: Consumes beer on the weekends only   Drug use: No   Sexual activity: Not on file  Other Topics Concern   Not on file  Social History Narrative   Not on  file   Social Determinants of Health   Financial Resource Strain: Not on file  Food Insecurity: Not on file  Transportation Needs: Not on file  Physical Activity: Not on file  Stress: Not on file  Social Connections: Not on file  Intimate Partner Violence: Not on file      Marcial Pacas, M.D. Ph.D.  [facility] [address], [place], [place] [address] Ph: [phone] Fax: [phone]  CC:  Tisovec, Colleen Him, MD [place],  [place] [address]  Tisovec, Colleen Him, MD    Total time spent reviewing the chart, obtaining history, examined  patient, ordering tests, documentation, consultations and family, care coordination was 55 minutes

## 2022-04-29 NOTE — Telephone Encounter (Signed)
[facility] tried to reach the patient multiple times to schedule the CTA head and neck-I will ask them to call her again.  The CT chest: A Physician-to-Physician discussion is required to complete the Notification Process. The ordering physician must call [phone] and select option #3 to engage in a Physician-to-Physician discussion. Case Number: [id number]

## 2022-04-29 NOTE — Telephone Encounter (Signed)
Please help me peer to peer

## 2022-05-05 NOTE — Telephone Encounter (Signed)
Spoke with Maudie Mercury today, IVIG denied. The primary dx of G54.9 is not a covered dx. Insurance is requesting a different dx code. She did not have a list of covered dx codes.

## 2022-05-10 ENCOUNTER — Ambulatory Visit: Payer: 59 | Admitting: Neurology

## 2022-05-10 ENCOUNTER — Telehealth: Payer: Self-pay | Admitting: Neurology

## 2022-05-10 NOTE — Telephone Encounter (Signed)
CT chest wo was approved to Rule out paraneoplasty  [phone], exp Jan 4th 2024

## 2022-05-10 NOTE — Telephone Encounter (Signed)
Peer to peer connected with Dr. Krista Blue

## 2022-05-10 NOTE — Telephone Encounter (Signed)
She is scheduled for 12/14 at [facility]

## 2022-05-17 ENCOUNTER — Other Ambulatory Visit: Payer: 59

## 2022-05-17 DIAGNOSIS — R202 Paresthesia of skin: Secondary | ICD-10-CM

## 2022-05-17 DIAGNOSIS — G549 Nerve root and plexus disorder, unspecified: Secondary | ICD-10-CM

## 2022-05-18 LAB — MULTIPLE SCLEROSIS PANEL 2
Albumin Serum: 3.6 g/dL (ref 3.6–5.1)
Albumin, CSF: 38.3 mg/dL (ref 8.0–42.0)
CNS-IgG Synthesis Rate: -4.6 mg/24 h (ref <=3.3)
IgG (Immunoglobin G), Serum: 705 mg/dL (ref 600–1640)
IgG Total CSF: 2.9 mg/dL (ref 0.8–7.7)
IgG-Index: 0.39 (ref <0.70)
Myelin Basic Protein: <2 mcg/L (ref <=4.0)
Oligo Bands: ABSENT

## 2022-05-18 LAB — PROTEIN, CSF: Total Protein, CSF: 61 mg/dL — ABNORMAL HIGH (ref 15–45)

## 2022-05-18 LAB — FUNGUS CULTURE W SMEAR
CULTURE:: NO GROWTH
MICRO NUMBER:: 14117911
SMEAR:: NONE SEEN
SPECIMEN QUALITY:: ADEQUATE

## 2022-05-18 LAB — GRAM STAIN
MICRO NUMBER:: 14117910
SPECIMEN QUALITY:: ADEQUATE

## 2022-05-18 LAB — VDRL, CSF: VDRL Quant, CSF: NONREACTIVE

## 2022-05-18 LAB — CSF CELL COUNT WITH DIFFERENTIAL
RBC Count, CSF: 0 cells/uL
TOTAL NUCLEATED CELL: 1 cells/uL (ref 0–5)

## 2022-05-18 LAB — GLUCOSE, CSF: Glucose, CSF: 73 mg/dL (ref 40–80)

## 2022-05-19 LAB — AUTOIMMUNE NEUROLOGY AB

## 2022-06-03 ENCOUNTER — Ambulatory Visit
Admission: RE | Admit: 2022-06-03 | Discharge: 2022-06-03 | Disposition: A | Discharge status: Home / Self Care | Payer: 59 | Source: Ambulatory Visit | Attending: Neurology | Admitting: Neurology

## 2022-06-03 DIAGNOSIS — R202 Paresthesia of skin: Secondary | ICD-10-CM

## 2022-06-03 DIAGNOSIS — G549 Nerve root and plexus disorder, unspecified: Secondary | ICD-10-CM

## 2022-06-07 ENCOUNTER — Ambulatory Visit: Payer: 59 | Admitting: Neurology

## 2022-06-07 ENCOUNTER — Encounter: Payer: Self-pay | Admitting: Neurology

## 2022-06-07 VITALS — BP 162/81 | HR 88 | Ht 66.0 in | Wt 130.5 lb

## 2022-06-07 DIAGNOSIS — M792 Neuralgia and neuritis, unspecified: Secondary | ICD-10-CM

## 2022-06-07 DIAGNOSIS — I679 Cerebrovascular disease, unspecified: Secondary | ICD-10-CM

## 2022-06-07 DIAGNOSIS — R269 Unspecified abnormalities of gait and mobility: Secondary | ICD-10-CM

## 2022-06-07 DIAGNOSIS — G549 Nerve root and plexus disorder, unspecified: Secondary | ICD-10-CM | POA: Diagnosis not present

## 2022-06-07 DIAGNOSIS — G610 Guillain-Barre syndrome: Secondary | ICD-10-CM

## 2022-06-07 MED ORDER — FOLIC ACID 1 MG PO TABS: 1.0000 mg | ORAL_TABLET | Freq: Every day | ORAL | 3 refills | Status: AC

## 2022-06-07 MED ORDER — DULOXETINE HCL 30 MG PO CPEP: 30.0000 mg | ORAL_CAPSULE | Freq: Every day | ORAL | 0 refills | Status: DC

## 2022-06-07 MED ORDER — DULOXETINE HCL 60 MG PO CPEP: 60.0000 mg | ORAL_CAPSULE | Freq: Every day | ORAL | 3 refills | Status: DC

## 2022-06-07 MED ORDER — GABAPENTIN 300 MG PO CAPS: 900.0000 mg | ORAL_CAPSULE | Freq: Three times a day (TID) | ORAL | 11 refills | Status: DC

## 2022-06-07 MED ORDER — METHOTREXATE SODIUM 2.5 MG PO TABS: 15.0000 mg | ORAL_TABLET | ORAL | 3 refills | Status: DC

## 2022-06-07 NOTE — Progress Notes (Addendum)
ASSESSMENT AND PLAN  [name] is a 61 y.o. female Guillain-Barr variant, sensory predominant Neuropathic pain Significant gait abnormality  Lumbar puncture  in October 2023 showed elevated total protein of 61,  Could not tolerate prednisone 60 mg, complains of agitation, start 40 mg daily since April 28, 2022, did help her mildly, but remain profound symptomatically, add on methotrexate 2.5 mg 6 tablets every week, folic acid 1 mg daily  IVIG preauthorization, 2 g/kg as loading dose then 1 g/kg every 3 weeks  Anxiety, agitation with high dose of steroid treatment Complex regional pain syndrome of right arm, under the care of preferred pain management,  On oxycodone 7.5 mg 3 times a day   Cerebrovascular disease  Right occipital stroke new since 2019, moderate to severe progressive periventricular small vessel disease  She does has vascular risk factor of heavy smoker, hyperlipidemia, moderate alcohol use     aspirin 81 mg daily increase water intake  Complete evaluation with echocardiogram, ultrasound of carotid artery  Worsening left vision,  Was evaluated by Dr. Nathanial Rancher on May 20, 2022, normal macular, good foveal reflex, no disc edema, nuclear sclerosis OU, homonymous bilateral field deficit, left side, from right occipital stroke,  DIAGNOSTIC DATA (LABS, IMAGING, TESTING) - I reviewed patient records, labs, notes, testing and imaging myself where available.  Labs in Oct 2023, normal or negative ANA, hypercoagulation panel, elevated homocystine 27.6, B12 level was more than 2000, creatinine 1.24, CPK 302, C-reactive protein, ESR, TSH,  MEDICAL HISTORY:  [name] was patient of Dr. Jannifer Franklin, recently followed up by Judson Roch, primary care physician is Dr., Osborne Casco, Fransico Him  I reviewed and summarized the referring note. PMHX. Complex regional pain syndrome, right hand and wrist ( due to repetitive hand motion) is under the care of pain management, taking  Norco 10/325 tid , Extended release oxycodone 29m bid Depression,  Left facial pain,  HLD Smoke 2ppd, she could not hold cigarette, now she is down to 11pd.  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 WJannifer Franklinsince July 2014 for left maxillary area pain, this happened after abscess tooth, multiple dental procedures, and his root canal involving left upper tooth, she was put on gabapentin 300 mg 3 times daily and Topamax 50 mg 1 and half tablets twice a day  Since retirement she has been very active, enjoying deer hunting, last hunting was in fall of 2022.  End of August 2023, woke up 1 morning she noticed numbness at the fingertip, sensitivity, also involving bilateral feet, symptoms gradually getting worse, now her fingers are so sensitive, is hard for her to use her hands, also complains of asending paresthesia to knee level now, unsteady gait, feel weak all over, has quit driving because she could not feel the brake and the wheel  She denies bowel and bladder incontinence,  Long history of gradual onset bilateral hands tremor, getting worse since her subacute onset ascending paresthesia gait abnormality  Personally reviewed MRI of the brain without contrast March 30, 2022,  extensive periventricular small vessel disease, which has been present since 2015, slow progression, but new right occipital stroke since last MRI scan in September 2019  MRI of cervical spine showed multilevel degenerative changes no canal stenosis, severe left foraminal narrowing at C4-5,  C5-6  Laboratory evaluations normal negative ANA, hypercoagulable panel, elevated homocystine, mild abnormal creatinine 1.24, CPK mild elevation 302, normal B12, ESR, C-reactive protein, TSH  She had lumbar puncture in August 2014 for evaluation of her extensive periventricular white matter changes, spinal fluid testing showed no significant abnormality, normal protein, 0 oligoclonal banding  Update April 07, 2022 Patient return for electrodiagnostic study today, which showed sensory neuropathy, well-preserved motor response, but absent sensory response all the nerves tested, Adding on Trileptal has eased up her foot pain to some degree, she can walk better, still have sensitivity of her finger, needle prick with light touch at the fingertips, burning sensation  Extensive laboratory evaluation showed normal negative HIV, INR, B12, Lyme titer, heavy metal, B12 alcohol, A1c was mildly elevated 5.9, hypercoagulable status was negative,  She has been a heavy smoker for many years, confirmed with patient the history, currently February 26, 2022, she function well, helped her neighbor cut a big tree limb 2 weeks  weeks prior to the incident, she was able to climb up a ladder to clean her gutter in August 2023 fairly acute onset of paresthesia evolving bilateral hands and feet walking up 1 morning, gait abnormality   Virtual Visit via phone UPDATE Apr 28 2022  I discussed the limitations of evaluation and management by telemedicine and the availability of in person appointments. The patient expressed understanding and agreed to proceed  Location: Provider: [place] office; Patient: Home with her sister, retired Equities trader  I connected with [name]  on Apr 28 2022 by a PHONE enabled telemedicine application and verified that I am speaking with the correct person using two identifiers.  UPDATED HiSTORY She was started on prednisone 60 mg April 07, 2022, only tried 1 dose, could not tolerated,  the same night she has visual hallucination, did not sleep,  Start IVIG preauthorization since April 19, 2022,  Patient complains of significant neuropathic pain, as if her legs are bonded by tapes, unsteady gait, she is on gabapentin 300 mg 3 times a day, tried Trileptal 150 mg once, make her sleepy and drowsy, no longer taking it  She is also on pain medication, taking Norco 10/325 mg up to 3 times a day as needed, under the care of preferred pain management  UPDATE Jun 07 2022: She is with her sister at today's clinical visit, she could not tolerate prednisone 60 mg, started 40 mg since early November 2023, tolerating it better, reported mild improvement, she can walk better, but still have bilateral hands and feet paresthesia, weakness, spent most of the time sitting down  Was evaluated by Dr. Nathanial Rancher on May 20, 2022, normal macular, good foveal reflex, no disc edema, nuclear sclerosis OU, homonymous bilateral field deficit, left side, from right occipital stroke,   CT chest on Jun 03 2022, showed no acute pathology, evidence of scarring or atelectasis, emphysema, aortic atherosclerotic disease     CSF on Apr 19 2022: TP 61, no significant abnormality otherwise  She continues to have significant neuropathic pain in her hands and feet, now on gabapentin 300 mg 2 tablets 3 times a day, add on Trileptal 150 mg twice a day, seems to help her some  We initiate preauthorization for  IVIG, but have trouble with her insurance, will clarify diagnosis code, retry again,    REVIEW OF SYSTEMS:  Full 14 system review of systems performed and notable only for as above All other review of systems were negative.   PHYSICAL EXAMNIATION:  Gen: NAD, conversant, well nourised, well groomed                     Cardiovascular: Regular rate rhythm, no peripheral edema, warm, nontender. Eyes: Conjunctivae clear without exudates or hemorrhage Neck: Supple, no carotid bruits. Pulmonary: Clear  to auscultation bilaterally   NEUROLOGICAL EXAM:  MENTAL STATUS: Speech/cognition: Awake, alert oriented to history taking and casual conversation  CRANIAL NERVES: CN II: Visual fields are full to confrontation.  Pupils are round equal and briskly reactive to light.  Visual acuity OS 20/70, OD 20/70 CN III, IV, VI: extraocular movement are normal. No ptosis. CN V: Facial sensation is intact to pinprick in all 3 divisions bilaterally. Corneal responses are intact.  CN VII: Face is symmetric with normal eye closure and smile. CN VIII: Hearing is normal to casual conversation CN IX, X: Palate elevates symmetrically. Phonation is normal. CN XI: Head turning and shoulder shrug are intact CN XII: Tongue is midline with normal movements and no atrophy.  MOTOR: Moderate bilateral grip finger abduction, finger extension/flexion weakness, mild proximal upper extremity and lower extremity muscle weakness, moderate bilateral toe flexion extension weakness  REFLEXES: Areflexia  SENSORY: Decreased vibratory sensation to mid shin level, pinprick to below knee level, absent toe and finger proprioception,  COORDINATION: Rapid alternating movements and fine finger movements are intact. There is no dysmetria on finger-to-nose and heel-knee-shin.    GAIT/STANCE: Need push-up to get up from seated position, wide-based, unsteady, positive Romberg signs,   ALLERGIES: Allergies  Allergen Reactions   Codeine     Other reaction(s): itching   Prednisone Other (See Comments)    Intolerant to high doses    HOME MEDICATIONS: Current Outpatient Medications  Medication Sig Dispense Refill   ALPRAZolam (XANAX) 0.5 MG tablet Take 1-2 tablets prior to MRI 1 additional tablet can be used as the scan starts if needed. MUST HAVE DRIVER 45 tablet 1   calcium carbonate (TUMS - DOSED IN MG ELEMENTAL CALCIUM) 500 MG chewable tablet Chew 1 tablet by mouth daily. OTC PRN     cetirizine (ZYRTEC) 10 MG tablet Take 10  mg by mouth daily as needed for allergies. OTC PRN     fenofibrate (TRICOR) 145 MG tablet Take 145 mg by mouth daily. with food  12   gabapentin (NEURONTIN) 300 MG capsule Take 2 capsules (600 mg total) by mouth 3 (three) times daily. 180 capsule 11   OXcarbazepine (TRILEPTAL) 150 MG tablet Take 1 tablet (150 mg total) by mouth 2 (two) times daily. 60 tablet 6   oxyCODONE-acetaminophen (PERCOCET) 7.5-325 MG tablet Take 1 tablet by mouth 3 (three) times daily as needed.     predniSONE (DELTASONE) 20 MG tablet 3 tabs every morning x2 weeks, 2.5 tabs every morning x 2 weeeks 2 tabs every morning x 2 weeks, Stay 1.5 tabs every morning 90 tablet 3   sertraline (ZOLOFT) 100 MG tablet Take 100 mg by mouth daily.     triamcinolone cream (KENALOG) 0.1 % Apply 1 application topically 2 (two) times daily.      XTAMPZA ER 18 MG C12A Take 1 capsule by mouth 2 (two) times daily.     No current facility-administered medications for this visit.  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    PAST SURGICAL HISTORY: Past Surgical History:  Procedure Laterality Date   ABDOMINAL HYSTERECTOMY  2012    FAMILY HISTORY: Family History  Problem Relation Age of Onset   Coronary artery disease Father    Prostate cancer Sister    Hypertension Brother    Congestive Heart Failure Mother     SOCIAL HISTORY: Social History   Socioeconomic History   Marital status: Divorced    Spouse name: Not on file   Number of children: 0   Years of education: Not on file   Highest education level: Not on file  Occupational History    Employer: [employer]  Tobacco Use   Smoking status: Every Day    Packs/day: 2.00    Types: Cigarettes   Smokeless tobacco: Never  Substance and Sexual Activity   Alcohol use: Yes    Comment: Consumes  beer on the weekends only   Drug use: No   Sexual activity: Not on file  Other Topics Concern   Not on file  Social History Narrative   Not on file   Social Determinants of Health   Financial Resource Strain: Not on file  Food Insecurity: Not on file  Transportation Needs: Not on file  Physical Activity: Not on file  Stress: Not on file  Social Connections: Not on file  Intimate Partner Violence: Not on file      Marcial Pacas, M.D. Ph.D.  [facility] [address], [place], [place] [address] Ph: [phone] Fax: [phone]  CC:  Tisovec, Fransico Him, MD [place],  [place] [address]  Tisovec, Fransico Him, MD    Total time spent reviewing the chart, obtaining history, examined patient, ordering tests, documentation, consultations and family, care coordination was 70  minutes

## 2022-06-08 LAB — COMPREHENSIVE METABOLIC PANEL
ALT: 22 IU/L (ref 0–32)
AST: 18 IU/L (ref 0–40)
Albumin/Globulin Ratio: 2.2 (ref 1.2–2.2)
Albumin: 4.7 g/dL (ref 3.8–4.9)
Alkaline Phosphatase: 63 IU/L (ref 44–121)
BUN/Creatinine Ratio: 7 — ABNORMAL LOW (ref 12–28)
BUN: 9 mg/dL (ref 8–27)
Bilirubin Total: 0.3 mg/dL (ref 0.0–1.2)
CO2: 22 mmol/L (ref 20–29)
Calcium: 9.9 mg/dL (ref 8.7–10.3)
Chloride: 97 mmol/L (ref 96–106)
Creatinine, Ser: 1.24 mg/dL — ABNORMAL HIGH (ref 0.57–1.00)
Globulin, Total: 2.1 g/dL (ref 1.5–4.5)
Glucose: 149 mg/dL — ABNORMAL HIGH (ref 70–99)
Potassium: 5.4 mmol/L — ABNORMAL HIGH (ref 3.5–5.2)
Sodium: 133 mmol/L — ABNORMAL LOW (ref 134–144)
Total Protein: 6.8 g/dL (ref 6.0–8.5)
eGFR: 50 mL/min/{1.73_m2} — ABNORMAL LOW (ref >59)

## 2022-06-08 LAB — CBC WITH DIFFERENTIAL
Basophils Absolute: 0.1 10*3/uL (ref 0.0–0.2)
Basos: 1 %
EOS (ABSOLUTE): 0 10*3/uL (ref 0.0–0.4)
Eos: 0 %
Hematocrit: 36.2 % (ref 34.0–46.6)
Hemoglobin: 12.6 g/dL (ref 11.1–15.9)
Immature Grans (Abs): 0 10*3/uL (ref 0.0–0.1)
Immature Granulocytes: 0 %
Lymphocytes Absolute: 0.9 10*3/uL (ref 0.7–3.1)
Lymphs: 10 %
MCH: 32.1 pg (ref 26.6–33.0)
MCHC: 34.8 g/dL (ref 31.5–35.7)
MCV: 92 fL (ref 79–97)
Monocytes Absolute: 0.2 10*3/uL (ref 0.1–0.9)
Monocytes: 2 %
Neutrophils Absolute: 8.1 10*3/uL — ABNORMAL HIGH (ref 1.4–7.0)
Neutrophils: 87 %
RBC: 3.92 x10E6/uL (ref 3.77–5.28)
RDW: 11 % — ABNORMAL LOW (ref 11.7–15.4)
WBC: 9.3 10*3/uL (ref 3.4–10.8)

## 2022-06-08 LAB — HGB A1C W/O EAG: Hgb A1c MFr Bld: 5.6 % (ref 4.8–5.6)

## 2022-06-08 LAB — FOLATE: Folate: 15.4 ng/mL (ref >3.0)

## 2022-06-28 ENCOUNTER — Ambulatory Visit (HOSPITAL_COMMUNITY): Payer: 59

## 2022-07-21 ENCOUNTER — Other Ambulatory Visit: Payer: Self-pay | Admitting: Neurology

## 2022-07-27 ENCOUNTER — Telehealth: Payer: Self-pay

## 2022-07-27 NOTE — Telephone Encounter (Signed)
I spoke with [name] today and we discussed IVIG. She spoke with the pt and she will not be moving forward with the infusion at this point. She wanted Dr. Krista Blue to be aware of this information.

## 2022-07-28 NOTE — Telephone Encounter (Signed)
Patient Is on schedule for March 11, will discuss treatment options then

## 2022-08-30 ENCOUNTER — Encounter: Payer: Self-pay | Admitting: Neurology

## 2022-08-30 ENCOUNTER — Ambulatory Visit (INDEPENDENT_AMBULATORY_CARE_PROVIDER_SITE_OTHER): Payer: 59 | Admitting: Neurology

## 2022-08-30 VITALS — BP 150/76 | HR 83 | Ht 66.0 in | Wt 131.0 lb

## 2022-08-30 DIAGNOSIS — G549 Nerve root and plexus disorder, unspecified: Secondary | ICD-10-CM | POA: Diagnosis not present

## 2022-08-30 DIAGNOSIS — G6181 Chronic inflammatory demyelinating polyneuritis: Secondary | ICD-10-CM | POA: Diagnosis not present

## 2022-08-30 DIAGNOSIS — I639 Cerebral infarction, unspecified: Secondary | ICD-10-CM

## 2022-08-30 NOTE — Progress Notes (Signed)
ASSESSMENT AND PLAN  [name] is a 62 y.o. female CIDP, sensory predominant Neuropathic pain Gait abnormality  Lumbar puncture  in October 2023 showed elevated total protein of 61,  Could not tolerate prednisone 60 mg, complains of agitation, start 40 mg daily since April 28, 2022, did help her mildly, but remain profound symptomatically, add on methotrexate 2.5 mg 6 tablets every week, folic acid 1 mg daily  IVIG was approved, decided to hold off, could not afford the high co-pay,  Her symptoms overall has much improved, with current treatment, will keep methotrexate at 15 mg weekly, continue tapering down prednisone to 5 mg daily,  Her gait abnormality has much improved   Complex regional pain syndrome of right arm, under the care of preferred pain management,  On oxycodone 7.5 mg 3 times a day   Cerebrovascular disease  Right occipital stroke new since 2019, moderate to severe progressive periventricular small vessel disease  She does has vascular risk factor of heavy smoker, hyperlipidemia, moderate alcohol use     aspirin 81 mg daily increase water intake  Previously ordered echocardiogram, ultrasound of carotid artery, do not carry through worry about the co-pay  Emphasized importance of smoke cessation  Worsening left vision,  Was evaluated by Dr. Nathanial Rancher on May 20, 2022, normal macular, good foveal reflex, no disc edema, nuclear sclerosis OU, homonymous bilateral field deficit, left side, from right occipital stroke,  DIAGNOSTIC DATA (LABS, IMAGING, TESTING) - I reviewed patient records, labs, notes, testing and imaging myself where available.  Labs in Oct 2023, normal or negative ANA, hypercoagulation panel, elevated homocystine 27.6, B12 level was more than 2000, creatinine 1.24, CPK 302, C-reactive protein, ESR, TSH,  MEDICAL HISTORY:  [name] was patient of Dr. Jannifer Franklin, recently followed up by Judson Roch, primary care physician is Dr., Osborne Casco,  Fransico Him  I reviewed and summarized the referring note. PMHX. Complex regional pain syndrome, right hand and wrist ( due to repetitive hand motion) is under the care of pain management, taking Norco 10/325 tid , Extended release oxycodone '18mg'$  bid Depression,  Left facial pain,  HLD Smoke 2ppd, she could not hold cigarette, now she is down to 11pd.  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 Jannifer Franklin since July 2014 for left maxillary area pain, this happened after abscess tooth, multiple dental procedures, and his root canal involving left upper tooth, she was put on gabapentin 300 mg 3 times daily and Topamax 50 mg 1 and half tablets twice a day  Since retirement she has been very active, enjoying deer hunting, last hunting was in fall of 2022.  End of August 2023, woke up 1 morning she noticed numbness at the fingertip, sensitivity, also involving bilateral feet, symptoms gradually getting worse, now her fingers are so sensitive, is hard for her to use her hands, also complains of asending paresthesia to knee level now, unsteady gait, feel weak all over, has quit driving because she could not feel the brake and the wheel  She  denies bowel and bladder incontinence,  Long history of gradual onset bilateral hands tremor, getting worse since her subacute onset ascending paresthesia gait abnormality  Personally reviewed MRI of the brain without contrast March 30, 2022, extensive periventricular small vessel disease, which has been present since 2015, slow progression, but new  right occipital stroke since last MRI scan in September 2019  MRI of cervical spine showed multilevel degenerative changes no canal stenosis, severe left foraminal narrowing at C4-5, C5-6  Laboratory evaluations normal negative ANA, hypercoagulable panel, elevated homocystine, mild abnormal creatinine 1.24, CPK mild elevation 302, normal B12, ESR, C-reactive protein, TSH  She had lumbar puncture in August 2014 for evaluation of her extensive periventricular white matter changes, spinal fluid testing showed no significant abnormality, normal protein, 0 oligoclonal banding  Update April 07, 2022 Patient return for electrodiagnostic study today, which showed sensory neuropathy, well-preserved motor response, but absent sensory response all the nerves tested, Adding on Trileptal has eased up her foot pain to some degree, she can walk better, still have sensitivity of her finger, needle prick with light touch at the fingertips, burning sensation  Extensive laboratory evaluation showed normal negative HIV, INR, B12, Lyme titer, heavy metal, B12 alcohol, A1c was mildly elevated 5.9, hypercoagulable status was negative,  She has been a heavy smoker for many years, confirmed with patient the history, currently February 26, 2022, she function well, helped her neighbor cut a big tree limb 2 weeks  weeks prior to the incident, she was able to climb up a ladder to clean her gutter in August 2023 fairly acute onset of paresthesia evolving bilateral hands and feet walking up 1 morning, gait abnormality   Virtual Visit via phone UPDATE Apr 28 2022  I discussed the limitations of evaluation and management by telemedicine and the availability of in person appointments. The patient expressed understanding and agreed to proceed  Location: Provider: [place] office; Patient: Home with her sister, retired Equities trader  I connected with [name]  on Apr 28 2022 by a PHONE enabled telemedicine application and  verified that I am speaking with the correct person using two identifiers.  UPDATED HiSTORY She was started on prednisone 60 mg April 07, 2022, only tried 1 dose, could not tolerated, the same night she has visual hallucination, did not sleep,  Start IVIG preauthorization since April 19, 2022,  Patient complains of significant neuropathic pain, as if her legs are bonded by tapes, unsteady gait, she is on gabapentin 300 mg 3 times a day, tried Trileptal 150 mg once, make her sleepy and drowsy, no longer taking it  She is also on pain medication, taking Norco 10/325 mg up to 3 times a day as needed, under the care of preferred pain management  UPDATE Jun 07 2022: She is with her sister at today's clinical visit, she could not tolerate prednisone 60 mg, started 40 mg since early November 2023, tolerating it better, reported mild improvement, she can walk better, but still have bilateral hands and feet paresthesia, weakness, spent most of the time sitting down  Was evaluated by Dr. Nathanial Rancher on May 20, 2022, normal macular, good foveal reflex, no disc edema, nuclear sclerosis OU, homonymous bilateral field deficit, left side, from right occipital stroke,   CT chest on Jun 03 2022, showed no acute pathology, evidence of scarring or atelectasis, emphysema, aortic atherosclerotic disease     CSF on Apr 19 2022: TP 61, no significant abnormality otherwise  She continues to have significant  neuropathic pain in her hands and feet, now on gabapentin 300 mg 2 tablets 3 times a day, add on Trileptal 150 mg twice a day, seems to help her some  We initiate preauthorization for IVIG, but have trouble with her insurance, will clarify diagnosis code, retry again,  UPDATE August 30 2022: She is accompanied by her sister at today's clinical visit, started prednisone 40 mg daily since last visit in December 2023, tolerating it better, now tapered down to 10 mg daily for 3 weeks, tapering down of the  prednisone did not cause worsening symptoms, she reported 80% improvement, can walk much better, tolerating methotrexate,  IVIG was approved, but she could not afford the high co-pay, still has fingertips and bilateral toes neuropathic pain  Both herself and her sister smoke at least a pack a day, encouraged them to stop smoking,  Laboratory evaluation in December [id number], normal folic acid, [id number], autoimmune neuropathy panel, CBC, CMP creatinine 1.24   REVIEW OF SYSTEMS:  Full 14 system review of systems performed and notable only for as above All other review of systems were negative.   PHYSICAL EXAMNIATION:  Gen: NAD, conversant, well nourised, well groomed                     Cardiovascular: Regular rate rhythm, no peripheral edema, warm, nontender. Eyes: Conjunctivae clear without exudates or hemorrhage Neck: Supple, no carotid bruits. Pulmonary: Clear to auscultation bilaterally   NEUROLOGICAL EXAM:  MENTAL STATUS: Speech/cognition: Awake, alert oriented to history taking and casual conversation  CRANIAL NERVES: CN II: Visual fields are full to confrontation.  Pupils are round equal and briskly reactive to light.  Visual acuity OS 20/70, OD 20/70 CN III, IV, VI: extraocular movement are normal. No ptosis. CN V: Facial sensation is intact to pinprick in all 3 divisions bilaterally. Corneal responses are intact.  CN VII: Face is symmetric with normal eye closure and smile. CN VIII: Hearing is normal to casual conversation CN IX, X: Palate elevates symmetrically. Phonation is normal. CN XI: Head turning and shoulder shrug are intact CN XII: Tongue is midline with normal movements and no atrophy.  MOTOR: Only mild bilateral toe flexion, extension weakness,  REFLEXES: Areflexia  SENSORY: Mildly decreased vibratory sensation pinprick to ankle level decreased toe proprioception    COORDINATION: Rapid alternating movements and fine finger movements are intact. There is no  dysmetria on finger-to-nose and heel-knee-shin.    GAIT/STANCE: Able to get up from seated position arm crossed, cautious, able to stand up on tiptoes and heels, mildly positive Romberg signs,   ALLERGIES: Allergies  Allergen Reactions   Codeine     Other reaction(s): itching   Prednisone Other (See Comments)    Intolerant to high doses    HOME MEDICATIONS: Current Outpatient Medications  Medication Sig Dispense Refill   ALPRAZolam (XANAX) 0.5 MG tablet Take 1-2 tablets prior to MRI 1 additional tablet can be used as the scan starts if needed. MUST HAVE DRIVER 45 tablet 1   calcium carbonate (TUMS - DOSED IN MG ELEMENTAL CALCIUM) 500 MG chewable tablet Chew 1 tablet by mouth daily. OTC PRN     cetirizine (ZYRTEC) 10 MG tablet Take 10 mg by mouth daily as needed for allergies. OTC PRN     fenofibrate (TRICOR) 145 MG tablet Take 145 mg by mouth daily. with food  12   folic acid (FOLVITE) 1 MG tablet Take 1 tablet (1 mg total) by mouth daily. 90 tablet 3  gabapentin (NEURONTIN) 300 MG capsule Take 3 capsules (900 mg total) by mouth 3 (three) times daily. 270 capsule 11   methotrexate (RHEUMATREX) 2.5 MG tablet Take 6 tablets (15 mg total) by mouth once a week. Caution:Chemotherapy. Protect from light. 72 tablet 3   OXcarbazepine (TRILEPTAL) 150 MG tablet Take 1 tablet (150 mg total) by mouth 2 (two) times daily. 60 tablet 6   oxyCODONE-acetaminophen (PERCOCET) 7.5-325 MG tablet Take 1 tablet by mouth 3 (three) times daily as needed.     predniSONE (DELTASONE) 20 MG tablet TAKE 3 TABLETS BY MOUTH EVERY MORNING X2 WEEKS,THEN 2.5 TABS X2 WEEKS, THEN 2 TABS X2 WEEKS, THEN STAY AT 1.5 TABS BY MOUTH EVERY MORNING 90 tablet 3   sertraline (ZOLOFT) 100 MG tablet Take 100 mg by mouth daily.     triamcinolone cream (KENALOG) 0.1 % Apply 1 application topically 2 (two) times daily.      XTAMPZA ER 18 MG C12A Take 1 capsule by mouth 2 (two) times daily.     No current facility-administered  medications for this visit.    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    PAST SURGICAL HISTORY: Past Surgical History:  Procedure Laterality Date   ABDOMINAL HYSTERECTOMY  2012    FAMILY HISTORY: Family History  Problem Relation Age of Onset   Coronary artery disease Father    Prostate cancer Sister    Hypertension Brother    Congestive Heart Failure Mother     SOCIAL HISTORY: Social History   Socioeconomic History   Marital status: Divorced    Spouse name: Not on file   Number of children: 0   Years of education: Not on file   Highest education level: Not on file  Occupational History    Employer: [employer]  Tobacco Use   Smoking status: Every Day    Packs/day: 2.00    Types: Cigarettes   Smokeless tobacco: Never  Substance and Sexual Activity   Alcohol use: Yes    Comment: Consumes beer on the weekends only   Drug use: No   Sexual activity: Not on file  Other Topics Concern   Not on file  Social History Narrative   Not on file   Social Determinants of Health   Financial Resource Strain: Not on file  Food Insecurity: Not on file  Transportation Needs: Not on file  Physical Activity: Not on file  Stress: Not on file  Social Connections: Not on file  Intimate Partner Violence: Not on file      Marcial Pacas, M.D. Ph.D.  [facility] [address] [place], [place] [address] Ph: [phone] Fax: [phone]  CC:  Tisovec, Fransico Him, MD [place],   [id number]  Tisovec, Fransico Him, MD

## 2022-09-19 ENCOUNTER — Other Ambulatory Visit: Payer: Self-pay | Admitting: Neurology

## 2022-10-09 ENCOUNTER — Other Ambulatory Visit: Payer: Self-pay | Admitting: Neurology

## 2022-11-04 ENCOUNTER — Other Ambulatory Visit: Payer: Self-pay | Admitting: Neurology

## 2023-01-31 ENCOUNTER — Encounter: Payer: Self-pay | Admitting: Neurology

## 2023-01-31 ENCOUNTER — Ambulatory Visit: Payer: 59 | Admitting: Neurology

## 2023-01-31 VITALS — BP 150/86 | HR 73 | Ht 66.0 in | Wt 119.5 lb

## 2023-01-31 DIAGNOSIS — G6181 Chronic inflammatory demyelinating polyneuritis: Secondary | ICD-10-CM

## 2023-01-31 DIAGNOSIS — I639 Cerebral infarction, unspecified: Secondary | ICD-10-CM | POA: Diagnosis not present

## 2023-01-31 DIAGNOSIS — M21371 Foot drop, right foot: Secondary | ICD-10-CM

## 2023-01-31 DIAGNOSIS — R269 Unspecified abnormalities of gait and mobility: Secondary | ICD-10-CM

## 2023-01-31 DIAGNOSIS — G549 Nerve root and plexus disorder, unspecified: Secondary | ICD-10-CM | POA: Diagnosis not present

## 2023-01-31 MED ORDER — ALPRAZOLAM 0.5 MG PO TABS: ORAL_TABLET | ORAL | 0 refills | Status: AC

## 2023-01-31 NOTE — Progress Notes (Signed)
ASSESSMENT AND PLAN  [name] is a 62 y.o. female CIDP, sensory predominant Neuropathic pain Gait abnormality Right low back pain radiating pain to right hip  Lumbar puncture  in October 2023 showed elevated total protein of 61,  Could not tolerate prednisone 60 mg, complains of agitation, start 40 mg daily since April 28, 2022, did help her mildly, but remain profound symptomatically, add on methotrexate 2.5 mg 6 tablets every week, folic acid 1 mg daily  IVIG was approved, decided to hold off, could not afford the high co-pay,  Her symptoms overall has much improved, with current treatment, will keep methotrexate at 15 mg weekly, continue tapering down prednisone, she tapered off her prednisone 5 mg gradually since May 2024  Since June 2024 she had worsening low back pain, radiating pain to right hip, right ankle weakness, today's examination showed right ankle drop, highly suspicious for right lumbar radiculopathy,  MRI of lumbar,  Repeat EMG nerve conduction study,  She is going to have laboratory by her primary care physician,    DIAGNOSTIC DATA (LABS, IMAGING, TESTING) - I reviewed patient records, labs, notes, testing and imaging myself where available.  Labs in Oct 2023, normal or negative ANA, hypercoagulation panel, elevated homocystine 27.6, B12 level was more than 2000, creatinine 1.24, CPK 302, C-reactive protein, ESR, TSH,  MEDICAL HISTORY:  [name] was patient of Dr. Anne Hahn, recently followed up by Maralyn Sago, primary care physician is Dr., Wylene Simmer, Adelfa Koh  I reviewed and summarized the referring note. PMHX. Complex regional pain syndrome, right hand and wrist ( due to repetitive hand motion) is under the care of pain management, taking Norco 10/325 tid , Extended release oxycodone 18mg  bid Depression,  Left facial pain,  HLD Smoke 2ppd, she could not hold cigarette, now she is down to 11pd.  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 Anne Hahn since July 2014 for left maxillary area pain, this happened after abscess tooth, multiple dental procedures, and his root canal involving left upper tooth, she was put on gabapentin 300 mg 3 times daily and Topamax 50 mg 1 and half tablets twice a day  Since retirement she has been very active, enjoying deer hunting, last hunting was in fall of 2022.  End of August 2023, woke up 1 morning she noticed numbness at the fingertip, sensitivity, also involving bilateral feet, symptoms gradually getting worse, now her fingers are so sensitive, is hard for her to use her hands, also complains of asending paresthesia to knee level now, unsteady gait, feel weak all over, has quit driving because she could not feel the brake and the wheel  She denies bowel and bladder incontinence,  Long history of gradual onset bilateral hands tremor, getting worse since her subacute onset ascending paresthesia gait abnormality  Personally reviewed MRI of the brain without contrast March 30, 2022, extensive periventricular small vessel disease, which has been present since 2015, slow progression, but new right occipital stroke since last MRI scan in September 2019  MRI of  cervical spine showed multilevel degenerative changes no canal stenosis, severe left foraminal narrowing at C4-5, C5-6  Laboratory evaluations normal negative ANA, hypercoagulable panel, elevated homocystine, mild abnormal creatinine 1.24, CPK mild elevation 302, normal B12, ESR, C-reactive protein, TSH  She had lumbar puncture in August 2014 for  evaluation of her extensive periventricular white matter changes, spinal fluid testing showed no significant abnormality, normal protein, 0 oligoclonal banding  Update April 07, 2022 Patient return for electrodiagnostic study today, which showed sensory neuropathy, well-preserved motor response, but absent sensory response all the nerves tested, Adding on Trileptal has eased up her foot pain to some degree, she can walk better, still have sensitivity of her finger, needle prick with light touch at the fingertips, burning sensation  Extensive laboratory evaluation showed normal negative HIV, INR, B12, Lyme titer, heavy metal, B12 alcohol, A1c was mildly elevated 5.9, hypercoagulable status was negative,  She has been a heavy smoker for many years, confirmed with patient the history, currently February 26, 2022, she function well, helped her neighbor cut a big tree limb 2 weeks  weeks prior to the incident, she was able to climb up a ladder to clean her gutter in August 2023 fairly acute onset of paresthesia evolving bilateral hands and feet walking up 1 morning, gait abnormality   Virtual Visit via phone UPDATE Apr 28 2022  I discussed the limitations of evaluation and management by telemedicine and the availability of in person appointments. The patient expressed understanding and agreed to proceed  Location: Provider: [facility] office; Patient: Home with her sister, retired Designer, jewellery  I connected with Kenney Houseman  on Apr 28 2022 by a PHONE enabled telemedicine application and verified that I am speaking with the correct person using two identifiers.  UPDATED HiSTORY She was started on prednisone 60 mg April 07, 2022, only tried 1 dose, could not tolerated, the same night she has visual hallucination, did not sleep,  Start IVIG preauthorization since April 19, 2022,  Patient complains of significant neuropathic pain, as if her legs are bonded by tapes, unsteady gait, she is on  gabapentin 300 mg 3 times a day, tried Trileptal 150 mg once, make her sleepy and drowsy, no longer taking it  She is also on pain medication, taking Norco 10/325 mg up to 3 times a day as needed, under the care of preferred pain management  UPDATE Jun 07 2022: She is with her sister at today's clinical visit, she could not tolerate prednisone 60 mg, started 40 mg since early November 2023, tolerating it better, reported mild improvement, she can walk better, but still have bilateral hands and feet paresthesia, weakness, spent most of the time sitting down  Was evaluated by Dr. Lyn Records on May 20, 2022, normal macular, good foveal reflex, no disc edema, nuclear sclerosis OU, homonymous bilateral field deficit, left side, from right occipital stroke,   CT chest on Jun 03 2022, showed no acute pathology, evidence of scarring or atelectasis, emphysema, aortic atherosclerotic disease     CSF on Apr 19 2022: TP 61, no significant abnormality otherwise  She continues to have significant neuropathic pain in her hands and feet, now on gabapentin 300 mg 2 tablets 3 times a day, add on Trileptal 150 mg twice a day, seems to help her some  We initiate preauthorization for IVIG, but have trouble with her insurance, will clarify diagnosis code, retry again,  UPDATE August 30 2022: She is accompanied by her sister at today's clinical visit,  started prednisone 40 mg daily since last visit in December 2023, tolerating it better, now tapered down to 10 mg daily for 3 weeks, tapering down of the prednisone did not cause worsening symptoms, she reported 80% improvement, can walk much better, tolerating methotrexate,  IVIG was approved, but she could not afford the high co-pay, still has fingertips and bilateral toes neuropathic pain  Both herself and her sister smoke at least a pack a day, encouraged them to stop smoking,  Laboratory evaluation in December 2023, normal folic acid, A1c, autoimmune  neuropathy panel, CBC, CMP creatinine 1.24  UPDATE January 31 2023: She was overall doing better well, able to self tapering off prednisone from 5 mg since May 2024, no longer on prednisone treatment, tolerating methotrexate 15 mg weekly, but would like to go to lower dose if possible  In June 2024, she had worsening low back pain, radiating pain to right hip, then developed right ankle weakness, increased gait abnormality, aching right foot,  She continue to have bilateral lower extremity and the fingertips paresthesia    REVIEW OF SYSTEMS:  Full 14 system review of systems performed and notable only for as above All other review of systems were negative.   PHYSICAL EXAMNIATION:  Gen: NAD, conversant, well nourised, well groomed                     Cardiovascular: Regular rate rhythm, no peripheral edema, warm, nontender. Eyes: Conjunctivae clear without exudates or hemorrhage Neck: Supple, no carotid bruits. Pulmonary: Clear to auscultation bilaterally   NEUROLOGICAL EXAM:  MENTAL STATUS: Speech/cognition: Awake, alert oriented to history taking and casual conversation  CRANIAL NERVES: CN II: Visual fields are full to confrontation.  Pupils are round equal and briskly reactive to light.  Visual acuity OS 20/70, OD 20/70 CN III, IV, VI: extraocular movement are normal. No ptosis. CN V: Facial sensation is intact to pinprick in all 3 divisions bilaterally. Corneal responses are intact.  CN VII: Face is symmetric with normal eye closure and smile. CN VIII: Hearing is normal to casual conversation CN IX, X: Palate elevates symmetrically. Phonation is normal. CN XI: Head turning and shoulder shrug are intact CN XII: Tongue is midline with normal movements and no atrophy.  MOTOR: Moderate right ankle dorsiflexion weakness  REFLEXES: Areflexia  SENSORY: Mildly decreased vibratory sensation pinprick to ankle level decreased toe proprioception    COORDINATION: Rapid alternating  movements and fine finger movements are intact. There is no dysmetria on finger-to-nose and heel-knee-shin.    GAIT/STANCE: Difficulty performing bilateral heel walking, right worse than left   ALLERGIES: Allergies  Allergen Reactions   Codeine     Other reaction(s): itching   Prednisone Other (See Comments)    Intolerant to high doses    HOME MEDICATIONS: Current Outpatient Medications  Medication Sig Dispense Refill   ALPRAZolam (XANAX) 0.5 MG tablet Take 1-2 tablets prior to MRI 1 additional tablet can be used as the scan starts if needed. MUST HAVE DRIVER 45 tablet 1   calcium carbonate (TUMS - DOSED IN MG ELEMENTAL CALCIUM) 500 MG chewable tablet Chew 1 tablet by mouth daily. OTC PRN     cetirizine (ZYRTEC) 10 MG tablet Take 10 mg by mouth daily as needed for allergies. OTC PRN     fenofibrate (TRICOR) 145 MG tablet Take 145 mg by mouth daily. with food  12   folic acid (FOLVITE) 1 MG tablet Take 1 tablet (1 mg total) by mouth daily. 90  tablet 3   gabapentin (NEURONTIN) 300 MG capsule Take 3 capsules (900 mg total) by mouth 3 (three) times daily. 270 capsule 11   methotrexate (RHEUMATREX) 2.5 MG tablet Take 6 tablets (15 mg total) by mouth once a week. Caution:Chemotherapy. Protect from light. 72 tablet 3   OXcarbazepine (TRILEPTAL) 150 MG tablet TAKE 1 TABLET BY MOUTH TWICE A DAY 60 tablet 6   oxyCODONE-acetaminophen (PERCOCET) 7.5-325 MG tablet Take 1 tablet by mouth 3 (three) times daily as needed.     sertraline (ZOLOFT) 100 MG tablet Take 100 mg by mouth daily.     topiramate (TOPAMAX) 50 MG tablet TAKE 1.5 TABLETS BY MOUTH TWICE DAILY 270 tablet 3   triamcinolone cream (KENALOG) 0.1 % Apply 1 application topically 2 (two) times daily.      XTAMPZA ER 18 MG C12A Take 1 capsule by mouth 2 (two) times daily.     predniSONE (DELTASONE) 20 MG tablet TAKE 3 TABLETS BY MOUTH EVERY MORNING X2 WEEKS,THEN 2.5 TABS X2 WEEKS, THEN 2 TABS X2 WEEKS, THEN STAY AT 1.5 TABS BY MOUTH EVERY  MORNING (Patient not taking: Reported on 01/31/2023) 90 tablet 3   No current facility-administered medications for this visit.    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    PAST SURGICAL HISTORY: Past Surgical History:  Procedure Laterality Date   ABDOMINAL HYSTERECTOMY  2012    FAMILY HISTORY: Family History  Problem Relation Age of Onset   Coronary artery disease Father    Prostate cancer Sister    Hypertension Brother    Congestive Heart Failure Mother     SOCIAL HISTORY: Social History   Socioeconomic History   Marital status: Divorced    Spouse name: Not on file   Number of children: 0   Years of education: Not on file   Highest education level: Not on file  Occupational History    Employer: [employer]  Tobacco Use   Smoking status: Every Day    Current packs/day: 2.00    Types: Cigarettes   Smokeless tobacco: Never  Substance and Sexual Activity   Alcohol use: Yes    Comment: Consumes beer on the weekends only   Drug use: No   Sexual activity: Not on file  Other Topics Concern   Not on file  Social History Narrative   Not on file   Social Determinants of Health   Financial Resource Strain: Not on file  Food Insecurity: Not on file  Transportation Needs: Not on file  Physical Activity: Not on file  Stress: Not on file  Social Connections: Not on file  Intimate Partner Violence: Not on file      Levert Feinstein, M.D. Ph.D.  [facility] [address] [place], [place] [address] Ph: [phone] Fax: [phone]  CC:  Tisovec, Adelfa Koh, MD [address] [place],  [place] [address]  Tisovec, Adelfa Koh, MD

## 2023-02-24 ENCOUNTER — Telehealth: Payer: Self-pay | Admitting: Neurology

## 2023-02-24 NOTE — Telephone Encounter (Signed)
I have called the patient twice to schedule her MRI and haven't heard back.

## 2023-04-06 ENCOUNTER — Ambulatory Visit (INDEPENDENT_AMBULATORY_CARE_PROVIDER_SITE_OTHER): Payer: 59 | Admitting: Neurology

## 2023-04-06 ENCOUNTER — Ambulatory Visit: Payer: 59 | Admitting: Neurology

## 2023-04-06 DIAGNOSIS — M21371 Foot drop, right foot: Secondary | ICD-10-CM | POA: Diagnosis not present

## 2023-04-06 DIAGNOSIS — G549 Nerve root and plexus disorder, unspecified: Secondary | ICD-10-CM

## 2023-04-06 DIAGNOSIS — R269 Unspecified abnormalities of gait and mobility: Secondary | ICD-10-CM

## 2023-04-06 DIAGNOSIS — I639 Cerebral infarction, unspecified: Secondary | ICD-10-CM

## 2023-04-06 DIAGNOSIS — G6181 Chronic inflammatory demyelinating polyneuritis: Secondary | ICD-10-CM

## 2023-04-06 DIAGNOSIS — H5462 Unqualified visual loss, left eye, normal vision right eye: Secondary | ICD-10-CM

## 2023-04-06 MED ORDER — OXCARBAZEPINE 150 MG PO TABS: 150.0000 mg | ORAL_TABLET | Freq: Two times a day (BID) | ORAL | 3 refills | Status: AC

## 2023-04-06 MED ORDER — TOPIRAMATE 50 MG PO TABS: ORAL_TABLET | ORAL | 3 refills | Status: DC

## 2023-04-06 MED ORDER — GABAPENTIN 300 MG PO CAPS: 900.0000 mg | ORAL_CAPSULE | Freq: Three times a day (TID) | ORAL | 11 refills | Status: DC

## 2023-04-06 MED ORDER — METHOTREXATE SODIUM 2.5 MG PO TABS: 15.0000 mg | ORAL_TABLET | ORAL | 3 refills | Status: AC

## 2023-04-06 NOTE — Progress Notes (Signed)
ASSESSMENT AND PLAN  [name] is a 62 y.o. female CIDP, sensory predominant Neuropathic pain Gait abnormality   Lumbar puncture  in October 2023 showed elevated total protein of 61,  Could not tolerate prednisone 60 mg, complains of agitation, start 40 mg daily since April 28, 2022, did help her mildly, but remain profound symptomatically, add on methotrexate 2.5 mg 6 tablets every week, folic acid 1 mg daily  IVIG was approved, decided to hold off, could not afford the high co-pay,  She began to have slow improvement after combination of prednisone, and the methotrexate, but worried about the side effect of prednisone, tapered it off since May 2024, noticed platoon of her improvement,  Repeat EMG nerve conduction study April 06, 2023 continue to show evidence of sensory predominant neuropathy, no significant change compared to previous study  Refilled her neuropathic pain medications Trileptal 150mg  2 times a day, gabapentin 300 mg 3 tablets 3 times a day,topamax 50mg  bid    Return To Clinic With NP In 9 Months   DIAGNOSTIC DATA (LABS, IMAGING, TESTING) - I reviewed patient records, labs, notes, testing and imaging myself where available.  Labs in Oct 2023, normal or negative ANA, hypercoagulation panel, elevated homocystine 27.6, B12 level was more than 2000, creatinine 1.24, CPK 302, C-reactive protein, ESR, TSH,  MEDICAL HISTORY:  [name] was patient of Dr. Anne Hahn, recently followed up by [name], primary care physician is Dr., Wylene Simmer, Adelfa Koh  I reviewed and summarized the referring note. PMHX. Complex regional pain syndrome, right hand and wrist ( due to repetitive hand motion) is under the care of pain management, taking Norco 10/325 tid , Extended release oxycodone 18mg  bid Depression,  Left facial pain,  HLD Smoke 2ppd, she could not hold cigarette, now she is down to 11pd.  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 Anne Hahn since July 2014 for left maxillary area pain, this happened after abscess tooth, multiple dental procedures, and his root canal involving left upper tooth, she was put on gabapentin 300 mg 3 times daily and Topamax 50 mg 1 and half tablets twice a day  Since retirement she has been very active, enjoying deer hunting, last hunting was in fall of 2022.  End of August 2023, woke up 1 morning she noticed numbness at the fingertip, sensitivity, also involving bilateral feet, symptoms gradually getting worse, now her fingers are so sensitive, is hard for her to use her hands, also complains of asending paresthesia to knee level now, unsteady gait, feel weak all over, has quit driving because she could not feel the brake and the wheel  She denies bowel and bladder incontinence,  Long history of gradual onset bilateral hands tremor, getting worse since her subacute onset ascending paresthesia gait abnormality  Personally reviewed MRI of the brain without contrast March 30, 2022, extensive periventricular small vessel disease, which has been present since 2015, slow progression, but new right occipital stroke since last MRI scan in September 2019  MRI of cervical spine  showed multilevel degenerative changes no canal stenosis, severe left foraminal narrowing at C4-5, C5-6  Laboratory evaluations normal negative ANA, hypercoagulable panel, elevated homocystine, mild abnormal creatinine 1.24, CPK mild elevation 302, normal B12, ESR, C-reactive protein, TSH  She had lumbar puncture in August 2014 for evaluation of her  extensive periventricular white matter changes, spinal fluid testing showed no significant abnormality, normal protein, 0 oligoclonal banding  Update April 07, 2022 Patient return for electrodiagnostic study today, which showed sensory neuropathy, well-preserved motor response, but absent sensory response all the nerves tested, Adding on Trileptal has eased up her foot pain to some degree, she can walk better, still have sensitivity of her finger, needle prick with light touch at the fingertips, burning sensation  Extensive laboratory evaluation showed normal negative HIV, INR, B12, Lyme titer, heavy metal, B12 alcohol, A1c was mildly elevated 5.9, hypercoagulable status was negative,  She has been a heavy smoker for many years, confirmed with patient the history, currently February 26, 2022, she function well, helped her neighbor cut a big tree limb 2 weeks  weeks prior to the incident, she was able to climb up a ladder to clean her gutter in August 2023 fairly acute onset of paresthesia evolving bilateral hands and feet walking up 1 morning, gait abnormality   Virtual Visit via phone UPDATE Apr 28 2022  I discussed the limitations of evaluation and management by telemedicine and the availability of in person appointments. The patient expressed understanding and agreed to proceed  Location: Provider: [facility] office; Patient: Home with her sister, retired Designer, jewellery  I connected with [name]  on Apr 28 2022 by a PHONE enabled telemedicine application and verified that I am speaking with the correct person using two identifiers.  UPDATED HiSTORY She was started on prednisone 60 mg April 07, 2022, only tried 1 dose, could not tolerated, the same night she has visual hallucination, did not sleep,  Start IVIG preauthorization since April 19, 2022,  Patient complains of significant neuropathic pain, as if her legs are bonded by tapes, unsteady gait, she is on gabapentin 300 mg 3 times a  day, tried Trileptal 150 mg once, make her sleepy and drowsy, no longer taking it  She is also on pain medication, taking Norco 10/325 mg up to 3 times a day as needed, under the care of preferred pain management  UPDATE Jun 07 2022: She is with her sister at today's clinical visit, she could not tolerate prednisone 60 mg, started 40 mg since early November 2023, tolerating it better, reported mild improvement, she can walk better, but still have bilateral hands and feet paresthesia, weakness, spent most of the time sitting down  Was evaluated by Dr. Lyn Records on May 20, 2022, normal macular, good foveal reflex, no disc edema, nuclear sclerosis OU, homonymous bilateral field deficit, left side, from right occipital stroke,   CT chest on Jun 03 2022, showed no acute pathology, evidence of scarring or atelectasis, emphysema, aortic atherosclerotic disease     CSF on Apr 19 2022: TP 61, no significant abnormality otherwise  She continues to have significant neuropathic pain in her hands and feet, now on gabapentin 300 mg 2 tablets 3 times a day, add on Trileptal 150 mg twice a day, seems to help her some  We initiate preauthorization for IVIG, but have trouble with her insurance, will clarify diagnosis code, retry again,  UPDATE August 30 2022: She is accompanied by her sister at today's clinical visit, started prednisone  40 mg daily since last visit in December 2023, tolerating it better, now tapered down to 10 mg daily for 3 weeks, tapering down of the prednisone did not cause worsening symptoms, she reported 80% improvement, can walk much better, tolerating methotrexate,  IVIG was approved, but she could not afford the high co-pay, still has fingertips and bilateral toes neuropathic pain  Both herself and her sister smoke at least a pack a day, encouraged them to stop smoking,  Laboratory evaluation in December 2023, normal folic acid, A1c, autoimmune neuropathy panel, CBC, CMP  creatinine 1.24  UPDATE January 31 2023: She was overall doing better well, able to self tapering off prednisone from 5 mg since May 2024, no longer on prednisone treatment, tolerating methotrexate 15 mg weekly, but would like to go to lower dose if possible  In June 2024, she had worsening low back pain, radiating pain to right hip, then developed right ankle weakness, increased gait abnormality, aching right foot,  She continue to have bilateral lower extremity and the fingertips paresthesia  Update April 06, 2023: Return for electrodiagnostic study today, continue showed evidence of sensory predominant neuropathy, no significant worsening compared to previous study  Clinical wise, she remained the same, reported she noticed gradual improvement when she was on prednisone, strings she tapered off prednisone in May 2024, her improvement has plateaued, continue on multiple medication for neuropathic pain, but there was no significant decline in her functional status    REVIEW OF SYSTEMS:  Full 14 system review of systems performed and notable only for as above All other review of systems were negative.   PHYSICAL EXAMNIATION:  Gen: NAD, conversant, well nourised, well groomed                     Cardiovascular: Regular rate rhythm, no peripheral edema, warm, nontender. Eyes: Conjunctivae clear without exudates or hemorrhage Neck: Supple, no carotid bruits. Pulmonary: Clear to auscultation bilaterally   NEUROLOGICAL EXAM:  MENTAL STATUS: Speech/cognition: Awake, alert oriented to history taking and casual conversation  CRANIAL NERVES: CN II: Visual fields are full to confrontation.  Pupils are round equal and briskly reactive to light.  Visual acuity OS 20/70, OD 20/70 CN III, IV, VI: extraocular movement are normal. No ptosis. CN V: Facial sensation is intact to pinprick in all 3 divisions bilaterally. Corneal responses are intact.  CN VII: Face is symmetric with normal eye  closure and smile. CN VIII: Hearing is normal to casual conversation CN IX, X: Palate elevates symmetrically. Phonation is normal. CN XI: Head turning and shoulder shrug are intact CN XII: Tongue is midline with normal movements and no atrophy.  MOTOR: No significant muscle weakness  REFLEXES: Areflexia  SENSORY: Mildly decreased vibratory sensation pinprick to ankle level decreased toe proprioception    COORDINATION: Rapid alternating movements and fine finger movements are intact. There is no dysmetria on finger-to-nose and heel-knee-shin.    GAIT/STANCE: Cautious   ALLERGIES: Allergies  Allergen Reactions   Codeine     Other reaction(s): itching   Prednisone Other (See Comments)    Intolerant to high doses    HOME MEDICATIONS: Current Outpatient Medications  Medication Sig Dispense Refill   ALPRAZolam (XANAX) 0.5 MG tablet Take 1-2 tablets prior to MRI 1 additional tablet can be used as the scan starts if needed. MUST HAVE DRIVER 45 tablet 1   ALPRAZolam (XANAX) 0.5 MG tablet Take 1-2 tablets 30 minutes prior to MRI, may repeat once as needed. Must have  driver. 3 tablet 0   calcium carbonate (TUMS - DOSED IN MG ELEMENTAL CALCIUM) 500 MG chewable tablet Chew 1 tablet by mouth daily. OTC PRN     cetirizine (ZYRTEC) 10 MG tablet Take 10 mg by mouth daily as needed for allergies. OTC PRN     fenofibrate (TRICOR) 145 MG tablet Take 145 mg by mouth daily. with food  12   folic acid (FOLVITE) 1 MG tablet Take 1 tablet (1 mg total) by mouth daily. 90 tablet 3   gabapentin (NEURONTIN) 300 MG capsule Take 3 capsules (900 mg total) by mouth 3 (three) times daily. 270 capsule 11   methotrexate (RHEUMATREX) 2.5 MG tablet Take 6 tablets (15 mg total) by mouth once a week. Caution:Chemotherapy. Protect from light. 72 tablet 3   OXcarbazepine (TRILEPTAL) 150 MG tablet TAKE 1 TABLET BY MOUTH TWICE A DAY 60 tablet 6   oxyCODONE-acetaminophen (PERCOCET) 7.5-325 MG tablet Take 1 tablet by mouth  3 (three) times daily as needed.     predniSONE (DELTASONE) 20 MG tablet TAKE 3 TABLETS BY MOUTH EVERY MORNING X2 WEEKS,THEN 2.5 TABS X2 WEEKS, THEN 2 TABS X2 WEEKS, THEN STAY AT 1.5 TABS BY MOUTH EVERY MORNING (Patient not taking: Reported on 01/31/2023) 90 tablet 3   sertraline (ZOLOFT) 100 MG tablet Take 100 mg by mouth daily.     topiramate (TOPAMAX) 50 MG tablet TAKE 1.5 TABLETS BY MOUTH TWICE DAILY 270 tablet 3   triamcinolone cream (KENALOG) 0.1 % Apply 1 application topically 2 (two) times daily.      XTAMPZA ER 18 MG C12A Take 1 capsule by mouth 2 (two) times daily.     No current facility-administered medications for this visit.    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    PAST SURGICAL HISTORY: Past Surgical History:  Procedure Laterality Date   ABDOMINAL HYSTERECTOMY  2012    FAMILY HISTORY: Family History  Problem Relation Age of Onset   Coronary artery disease Father    Prostate cancer Sister    Hypertension Brother    Congestive Heart Failure Mother     SOCIAL HISTORY: Social History   Socioeconomic History   Marital status: Divorced    Spouse name: Not on file   Number of children: 0   Years of education: Not on file   Highest education level: Not on file  Occupational History    Employer: [employer]  Tobacco Use   Smoking status: Every Day    Current packs/day: 2.00    Types: Cigarettes   Smokeless tobacco: Never  Substance and Sexual Activity   Alcohol use: Yes    Comment: Consumes beer on the weekends only   Drug use: No   Sexual activity: Not on file  Other Topics Concern   Not on file  Social History Narrative   Not on file   Social Determinants of Health   Financial Resource Strain: Not on file  Food Insecurity: Not on file  Transportation Needs: Not on  file  Physical Activity: Not on file  Stress: Not on file  Social Connections: Not on file  Intimate Partner Violence: Not on file      Levert Feinstein, M.D. Ph.D.  [facility] [address], [address] [place],  [place] [address] Ph: [phone] Fax: [phone]  CC:  Tisovec, Adelfa Koh, MD [address] [place],  [place] [address]  Tisovec, Adelfa Koh, MD

## 2023-04-06 NOTE — Procedures (Signed)
Full Name: [name] Gender: Female MRN #: [id number] Date of Birth: 03/19/1961    Visit Date: 04/06/2023 14:39 Age: 62 Years Examining Physician: Dr. Levert Feinstein Referring Physician: Dr. Levert Feinstein Height: 5 feet 6 inch History: 62 year old female presenting with lower extremity neuropathic pain, gait abnormality  Summary of the test: Nerve conduction study: Bilateral sural, superficial peroneal, right median, ulnar sensory responses were absent.  Right radial sensory response showed significantly decreased snap amplitude.  Bilateral tibial, right median, ulnar motor responses were normal.  Bilateral peroneal to EDB motor response showed slightly decreased CMAP amplitude.  Electromyography: Selected needle examination of right lower extremity muscles showed no significant abnormalities  Conclusion: This is an abnormal study.  There is electrodiagnostic evidence of sensory predominant peripheral neuropathy.  There was no significant change compared to previous study.    ------------------------------- Levert Feinstein M.D. PhD  [facility] [address] [place], [place] [address] Tel: [phone] Fax: [phone]  Verbal informed consent was obtained from the patient, patient was informed of potential risk of procedure, including bruising, bleeding, hematoma formation, infection, muscle weakness, muscle pain, numbness, among others.        MNC    Nerve / Sites Muscle Latency Ref. Amplitude Ref. Rel Amp Segments Distance Velocity Ref. Area    ms ms mV mV %  cm m/s m/s mVms  R Median - APB     Wrist APB 3.0 <=4.4 6.6 >=4.0 100 Wrist - APB 7   21.8     Upper arm APB 7.4  6.7  102 Upper arm - Wrist 24.6 57 >=49 22.6  R Ulnar - ADM     Wrist ADM 2.8 <=3.3 7.9 >=6.0 100 Wrist - ADM 7   20.9     B.Elbow ADM 4.5  7.4  94.1 B.Elbow - Wrist 11 64 >=49 20.7     A.Elbow ADM 7.6  6.5  87.8 A.Elbow - B.Elbow 16.6 53 >=49 19.6  L Peroneal - EDB      Ankle EDB 5.8 <=6.5 1.7 >=2.0 100 Ankle - EDB 9   6.7     Fib head EDB 13.6  1.7  102 Fib head - Ankle 26 33 >=44 7.7     Pop fossa EDB 15.7  1.7  96.6 Pop fossa - Fib head 13 61 >=44 4.8         Pop fossa - Ankle      R Peroneal - EDB     Ankle EDB 6.4 <=6.5 1.5 >=2.0 100 Ankle - EDB 9   4.8     Fib head EDB 12.1  1.0  66 Fib head - Ankle 24 41 >=44 4.1     Pop fossa EDB 14.6  1.5  152 Pop fossa - Fib head 11 45 >=44 2.9         Pop fossa - Ankle      L Tibial - AH     Ankle AH 4.3 <=5.8 5.6 >=4.0 100 Ankle - AH 9   16.6     Pop fossa AH 13.3  2.9  51.2 Pop fossa - Ankle 37 41 >=41 8.5  R Tibial - AH     Ankle AH 5.2 <=5.8 5.4 >=4.0 100 Ankle - AH 9   13.4     Pop fossa AH 13.4  4.8  88.9 Pop fossa - Ankle 35 43 >=41 13.8  SNC    Nerve / Sites Rec. Site Peak Lat Ref.  Amp Ref. Segments Distance    ms ms V V  cm  R Radial - Anatomical snuff box (Forearm)     Forearm Wrist 2.2 <=2.9 3 >=15 Forearm - Wrist 10  L Sural - Ankle (Calf)     Calf Ankle NR <=4.4 NR >=6 Calf - Ankle 14  R Sural - Ankle (Calf)     Calf Ankle NR <=4.4 NR >=6 Calf - Ankle 14  L Superficial peroneal - Ankle     Lat leg Ankle NR <=4.4 NR >=6 Lat leg - Ankle 14  R Superficial peroneal - Ankle     Lat leg Ankle NR <=4.4 NR >=6 Lat leg - Ankle 14  R Median - Orthodromic (Dig II, Mid palm)     Dig II Wrist NR <=3.4 NR >=10 Dig II - Wrist 13  R Ulnar - Orthodromic, (Dig V, Mid palm)     Dig V Wrist NR <=3.1 NR >=5 Dig V - Wrist 7                   F  Wave    Nerve F Lat Ref.   ms ms  L Tibial - AH 47.8 <=56.0  R Tibial - AH 52.7 <=56.0  R Ulnar - ADM 27.8 <=32.0           EMG Summary Table    Spontaneous MUAP Recruitment  Muscle IA Fib PSW Fasc Other Amp Dur. Poly Pattern  R. Tibialis anterior Normal None None None _______ Normal Normal Normal Normal  R. Tibialis posterior Normal None None None _______ Normal Normal Normal Normal  R. Gastrocnemius (Medial head) Normal None None None  _______ Normal Normal Normal Normal  R. Vastus lateralis Normal None None None _______ Normal Normal Normal Normal  R. Peroneus longus Normal None None None _______ Normal Normal Normal Normal

## 2023-05-19 ENCOUNTER — Other Ambulatory Visit: Payer: Self-pay | Admitting: Neurology

## 2024-01-10 ENCOUNTER — Ambulatory Visit: Payer: 59 | Admitting: Neurology

## 2024-01-10 ENCOUNTER — Encounter: Payer: Self-pay | Admitting: Neurology

## 2024-01-10 VITALS — BP 137/81 | HR 82 | Ht 66.0 in | Wt 106.8 lb

## 2024-01-10 DIAGNOSIS — G6181 Chronic inflammatory demyelinating polyneuritis: Secondary | ICD-10-CM

## 2024-01-10 DIAGNOSIS — J449 Chronic obstructive pulmonary disease, unspecified: Secondary | ICD-10-CM | POA: Insufficient documentation

## 2024-01-10 MED ORDER — PREDNISONE 20 MG PO TABS: 20.0000 mg | ORAL_TABLET | Freq: Every day | ORAL | 5 refills | Status: DC

## 2024-01-10 NOTE — Patient Instructions (Signed)
 Restart prednisone  20 mg daily, recommended the methotrexate  as well but since you wanted to hold off let me know if you change you mind. Continue other medications

## 2024-01-10 NOTE — Progress Notes (Addendum)
 ASSESSMENT AND PLAN  [name] is a 63 y.o. female CIDP, sensory predominant Neuropathic pain Gait abnormality   Lumbar puncture  in October 2023 showed elevated total protein of 61,  Could not tolerate prednisone  60 mg, complains of agitation, start 40 mg daily since April 28, 2022, did help her mildly, but remain profound symptomatically, add on methotrexate  2.5 mg 6 tablets every week, folic acid  1 mg daily  IVIG was approved, decided to hold off, could not afford the high co-pay,  She began to have slow improvement after combination of prednisone , and the methotrexate , but worried about the side effect of prednisone , tapered it off since May 2024, noticed platoon of her improvement,  Repeat EMG nerve conduction study April 06, 2023 continue to show evidence of sensory predominant neuropathy, no significant change compared to previous study  Since end of last year, worsening sensory deficit to feet/legs/hands since off prednisone , methotrexate   Restart prednisone , lower dose 20 mg daily, patient prefers to hold off on methotrexate .  Advised paraneoplastic panel, Sjogren's antibodies but patient refused.  Continue prednisone  until follow-up with Dr. Onita in 4 months.  Will continue long-term medications Trileptal , gabapentin , Topamax    DIAGNOSTIC DATA (LABS, IMAGING, TESTING) - I reviewed patient records, labs, notes, testing and imaging myself where available.  Labs in Oct 2023, normal or negative ANA, hypercoagulation panel, elevated homocystine 27.6, B12 level was more than 2000, creatinine 1.24, CPK 302, C-reactive protein, ESR, TSH,  Lab in September 2025: Hemoglobin of 13, CMP showed elevation of creatinine 1.7, GFR of 30.5  MEDICAL HISTORY:  [name] was patient of Dr. Jenel, recently followed up by Lauraine, primary care physician is Dr., Tisovec, Charlie ORN  I reviewed and summarized the referring note. PMHX. Complex regional pain syndrome, right hand and wrist ( due  to repetitive hand motion) is under the care of pain management, taking Norco 10/325 tid , Extended release oxycodone 18mg  bid Depression,  Left facial pain,  HLD Smoke 2ppd, she could not hold cigarette, now she is down to 11pd.  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 Jenel since July 2014 for left maxillary area pain, this happened after abscess tooth, multiple dental procedures, and his root canal involving left upper tooth, she was put on gabapentin  300 mg 3 times daily and Topamax  50 mg 1 and half tablets twice a day  Since retirement she has been very active, enjoying deer hunting, last hunting was in fall of 2022.  End of August 2023, woke up 1 morning she noticed numbness at the fingertip, sensitivity, also involving bilateral feet, symptoms gradually getting worse, now her fingers are so sensitive, is hard for her to use her hands, also complains of asending paresthesia to knee level now, unsteady gait, feel weak all over, has quit driving because she could not feel the brake and the wheel  She denies bowel and bladder incontinence,  Long history of gradual onset bilateral hands tremor, getting worse since her subacute onset ascending paresthesia gait abnormality  Personally  reviewed MRI of the brain without contrast March 30, 2022, extensive periventricular small vessel disease, which has been present since 2015, slow progression, but new right occipital stroke since last MRI scan in September 2019  MRI of cervical spine showed multilevel degenerative changes no  canal stenosis, severe left foraminal narrowing at C4-5, C5-6  Laboratory evaluations normal negative ANA, hypercoagulable panel, elevated homocystine, mild abnormal creatinine 1.24, CPK mild elevation 302, normal B12, ESR, C-reactive protein, TSH  She had lumbar puncture in August 2014 for evaluation of her extensive periventricular white matter changes, spinal fluid testing showed no significant abnormality, normal protein, 0 oligoclonal banding  Update April 07, 2022 Patient return for electrodiagnostic study today, which showed sensory neuropathy, well-preserved motor response, but absent sensory response all the nerves tested, Adding on Trileptal  has eased up her foot pain to some degree, she can walk better, still have sensitivity of her finger, needle prick with light touch at the fingertips, burning sensation  Extensive laboratory evaluation showed normal negative HIV, INR, B12, Lyme titer, heavy metal, B12 alcohol, A1c was mildly elevated 5.9, hypercoagulable status was negative,  She has been a heavy smoker for many years, confirmed with patient the history, currently February 26, 2022, she function well, helped her neighbor cut a big tree limb 2 weeks  weeks prior to the incident, she was able to climb up a ladder to clean her gutter in August 2023 fairly acute onset of paresthesia evolving bilateral hands and feet walking up 1 morning, gait abnormality  Virtual Visit via phone UPDATE Apr 28 2022  I discussed the limitations of evaluation and management by telemedicine and the availability of in person appointments. The patient expressed understanding and agreed to proceed  Location: Provider: [facility] office; Patient: Home with her sister, retired Designer, jewellery  I connected with Cy LULLA Rias  on Apr 28 2022 by a PHONE enabled telemedicine application and verified that I am speaking with the correct person using two identifiers.  UPDATED HiSTORY She was started on prednisone  60 mg  April 07, 2022, only tried 1 dose, could not tolerated, the same night she has visual hallucination, did not sleep,  Start IVIG preauthorization since April 19, 2022,  Patient complains of significant neuropathic pain, as if her legs are bonded by tapes, unsteady gait, she is on gabapentin  300 mg 3 times a day, tried Trileptal  150 mg once, make her sleepy and drowsy, no longer taking it  She is also on pain medication, taking Norco 10/325 mg up to 3 times a day as needed, under the care of preferred pain management  UPDATE Jun 07 2022: She is with her sister at today's clinical visit, she could not tolerate prednisone  60 mg, started 40 mg since early November 2023, tolerating it better, reported mild improvement, she can walk better, but still have bilateral hands and feet paresthesia, weakness, spent most of the time sitting down  Was evaluated by Dr. Lamar Bruckner on May 20, 2022, normal macular, good foveal reflex, no disc edema, nuclear sclerosis OU, homonymous bilateral field deficit, left side, from right occipital stroke,   CT chest on Jun 03 2022, showed no acute pathology, evidence of scarring or atelectasis, emphysema, aortic atherosclerotic disease     CSF on Apr 19 2022: TP 61, no significant abnormality otherwise  She continues to have significant neuropathic pain in her hands and feet, now on gabapentin  300 mg 2 tablets 3 times a day, add on Trileptal  150 mg twice a day, seems  to help her some  We initiate preauthorization for IVIG, but have trouble with her insurance, will clarify diagnosis code, retry again,  UPDATE August 30 2022: She is accompanied by her sister at today's clinical visit, started prednisone  40 mg daily since last visit in December 2023, tolerating it better, now tapered down to 10 mg daily for 3 weeks, tapering down of the prednisone  did not cause worsening symptoms, she reported 80% improvement, can walk much better, tolerating methotrexate ,  IVIG  was approved, but she could not afford the high co-pay, still has fingertips and bilateral toes neuropathic pain  Both herself and her sister smoke at least a pack a day, encouraged them to stop smoking,  Laboratory evaluation in December 2023, normal folic acid , A1c, autoimmune neuropathy panel, CBC, CMP creatinine 1.24  UPDATE January 31 2023: She was overall doing better well, able to self tapering off prednisone  from 5 mg since May 2024, no longer on prednisone  treatment, tolerating methotrexate  15 mg weekly, but would like to go to lower dose if possible  In June 2024, she had worsening low back pain, radiating pain to right hip, then developed right ankle weakness, increased gait abnormality, aching right foot,  She continue to have bilateral lower extremity and the fingertips paresthesia  Update April 06, 2023: Return for electrodiagnostic study today, continue showed evidence of sensory predominant neuropathy, no significant worsening compared to previous study  Clinical wise, she remained the same, reported she noticed gradual improvement when she was on prednisone , strings she tapered off prednisone  in May 2024, her improvement has plateaued, continue on multiple medication for neuropathic pain, but there was no significant decline in her functional status  Update January 10, 2024 SS: Stopped methotrexate  since October. Walking has improved, except she turns both ankles. Has mild-moderate numbness up to above knee level bilateral. Hands are numb. Takes gabapentin  600 mg BID, Trileptal  150 mg BID, Topamax  75 mg BID. Remains on aspirin 81 mg daily. Smokes 1.5 packs a day. Driving now, her sister lives with her. Has lost 30 lbs since I saw her in 2023 at onset of symptoms. Since fall 2024, feels like slow decline, in worsening numbness in her legs and hands.   REVIEW OF SYSTEMS:  Full 14 system review of systems performed and notable only for as above All other review of systems were  negative.  Physical Exam  General: The patient is alert and cooperative at the time of the examination.  Skin: No significant peripheral edema is noted.  Neurologic Exam  Mental status: The patient is alert and oriented x 3 at the time of the examination. The patient has apparent normal recent and remote memory, with an apparently normal attention span and concentration ability.  Cranial nerves: Facial symmetry is present. Speech is normal, no aphasia or dysarthria is noted. Extraocular movements are full. Visual fields are full.  Motor: 4/5 bilateral hip flexion, bilateral mild weak dorsiflexion  Sensory examination: Decreased soft touch, pinprick, vibration to mid shin level bilaterally.  Coordination: The patient has good finger-nose-finger and heel-to-shin bilaterally.  Has mild tremor with finger-nose-finger  Gait and station: Gait is cautious  Reflexes: Deep tendon reflexes are symmetric and normal.   ALLERGIES: Allergies  Allergen Reactions   Codeine     Other reaction(s): itching   Prednisone  Other (See Comments)    Intolerant to high doses    HOME MEDICATIONS: Current Outpatient Medications  Medication Sig Dispense Refill   calcium carbonate (TUMS - DOSED IN MG ELEMENTAL  CALCIUM) 500 MG chewable tablet Chew 1 tablet by mouth daily. OTC PRN     cetirizine (ZYRTEC) 10 MG tablet Take 10 mg by mouth daily as needed for allergies. OTC PRN     fenofibrate (TRICOR) 145 MG tablet Take 145 mg by mouth daily. with food  12   folic acid  (FOLVITE ) 1 MG tablet Take 1 tablet (1 mg total) by mouth daily. 90 tablet 3   gabapentin  (NEURONTIN ) 300 MG capsule Take 3 capsules (900 mg total) by mouth 3 (three) times daily. 270 capsule 11   OXcarbazepine  (TRILEPTAL ) 150 MG tablet Take 1 tablet (150 mg total) by mouth 2 (two) times daily. 180 tablet 3   oxyCODONE-acetaminophen  (PERCOCET) 7.5-325 MG tablet Take 1 tablet by mouth 3 (three) times daily as needed.     sertraline (ZOLOFT) 100  MG tablet Take 100 mg by mouth daily.     topiramate  (TOPAMAX ) 50 MG tablet TAKE 1.5 TABLETS BY MOUTH TWICE DAILY 270 tablet 3   triamcinolone cream (KENALOG) 0.1 % Apply 1 application topically 2 (two) times daily.      XTAMPZA ER 18 MG C12A Take 1 capsule by mouth 2 (two) times daily.     ALPRAZolam  (XANAX ) 0.5 MG tablet Take 1-2 tablets prior to MRI 1 additional tablet can be used as the scan starts if needed. MUST HAVE DRIVER (Patient not taking: Reported on 01/10/2024) 45 tablet 1   ALPRAZolam  (XANAX ) 0.5 MG tablet Take 1-2 tablets 30 minutes prior to MRI, may repeat once as needed. Must have driver. (Patient not taking: Reported on 01/10/2024) 3 tablet 0   methotrexate  (RHEUMATREX) 2.5 MG tablet Take 6 tablets (15 mg total) by mouth once a week. Caution:Chemotherapy. Protect from light. (Patient not taking: Reported on 01/10/2024) 72 tablet 3   predniSONE  (DELTASONE ) 20 MG tablet TAKE 3 TABLETS BY MOUTH EVERY MORNING X2 WEEKS,THEN 2.5 TABS X2 WEEKS, THEN 2 TABS X2 WEEKS, THEN STAY AT 1.5 TABS BY MOUTH EVERY MORNING (Patient not taking: Reported on 01/10/2024) 90 tablet 3   No current facility-administered medications for this visit.    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    PAST SURGICAL HISTORY: Past Surgical History:  Procedure Laterality Date   ABDOMINAL HYSTERECTOMY  2012    FAMILY HISTORY: Family History  Problem Relation Age of Onset   Coronary artery disease Father    Prostate cancer Sister    Hypertension Brother    Congestive Heart Failure Mother     SOCIAL HISTORY: Social History   Socioeconomic History   Marital status: Divorced    Spouse name: Not on file   Number of children: 0   Years of education: Not on file   Highest education level: Not on file  Occupational History     Employer: [employer]  Tobacco Use   Smoking status: Every Day    Current packs/day: 2.00    Types: Cigarettes   Smokeless tobacco: Never  Substance and Sexual Activity   Alcohol use: Yes    Comment: Consumes beer on the weekends only   Drug use: No   Sexual activity: Not on file  Other Topics Concern   Not on file  Social History Narrative   Not on file   Social Drivers  of Health   Financial Resource Strain: Not on file  Food Insecurity: Not on file  Transportation Needs: Not on file  Physical Activity: Not on file  Stress: Not on file  Social Connections: Not on file  Intimate Partner Violence: Not on file   Lauraine Born, SCHARLENE, DNP  [facility] [address] [place], [place] [address] [phone]

## 2024-04-06 ENCOUNTER — Other Ambulatory Visit: Payer: Self-pay | Admitting: Neurology

## 2024-04-22 ENCOUNTER — Other Ambulatory Visit: Payer: Self-pay | Admitting: Neurology

## 2024-04-23 ENCOUNTER — Telehealth: Payer: Self-pay | Admitting: Neurology

## 2024-04-23 NOTE — Telephone Encounter (Signed)
 Patient reschedule appointment due to family emergency out of town.

## 2024-04-26 ENCOUNTER — Ambulatory Visit: Admitting: Neurology

## 2024-07-06 ENCOUNTER — Other Ambulatory Visit: Payer: Self-pay | Admitting: Neurology

## 2024-07-09 NOTE — Telephone Encounter (Signed)
 Last seen on 12/21/23 Follow up scheduled on 09/18/24

## 2024-07-27 ENCOUNTER — Emergency Department (HOSPITAL_COMMUNITY)

## 2024-07-27 ENCOUNTER — Inpatient Hospital Stay (HOSPITAL_COMMUNITY): Admission: EM | Admit: 2024-07-27 | Source: Home / Self Care

## 2024-07-27 DIAGNOSIS — J441 Chronic obstructive pulmonary disease with (acute) exacerbation: Secondary | ICD-10-CM | POA: Diagnosis present

## 2024-07-27 DIAGNOSIS — R0603 Acute respiratory distress: Principal | ICD-10-CM

## 2024-07-27 LAB — CBC WITH DIFFERENTIAL/PLATELET
Abs Immature Granulocytes: 0.07 10*3/uL (ref 0.00–0.07)
Basophils Absolute: 0 10*3/uL (ref 0.0–0.1)
Basophils Relative: 0 %
Eosinophils Absolute: 0 10*3/uL (ref 0.0–0.5)
Eosinophils Relative: 0 %
HCT: 41.4 % (ref 36.0–46.0)
Hemoglobin: 12.5 g/dL (ref 12.0–15.0)
Immature Granulocytes: 1 %
Lymphocytes Relative: 13 %
Lymphs Abs: 0.8 10*3/uL (ref 0.7–4.0)
MCH: 25.8 pg — ABNORMAL LOW (ref 26.0–34.0)
MCHC: 30.2 g/dL (ref 30.0–36.0)
MCV: 85.5 fL (ref 80.0–100.0)
Monocytes Absolute: 0.7 10*3/uL (ref 0.1–1.0)
Monocytes Relative: 10 %
Neutro Abs: 4.7 10*3/uL (ref 1.7–7.7)
Neutrophils Relative %: 76 %
Platelets: 212 10*3/uL (ref 150–400)
RBC: 4.84 MIL/uL (ref 3.87–5.11)
RDW: 13.8 % (ref 11.5–15.5)
WBC: 6.2 10*3/uL (ref 4.0–10.5)
nRBC: 0 % (ref 0.0–0.2)

## 2024-07-27 LAB — COMPREHENSIVE METABOLIC PANEL WITH GFR
ALT: 21 U/L (ref 0–44)
AST: 25 U/L (ref 15–41)
Albumin: 3.6 g/dL (ref 3.5–5.0)
Alkaline Phosphatase: 99 U/L (ref 38–126)
Anion gap: 19 — ABNORMAL HIGH (ref 5–15)
BUN: 27 mg/dL — ABNORMAL HIGH (ref 8–23)
CO2: 19 mmol/L — ABNORMAL LOW (ref 22–32)
Calcium: 10.3 mg/dL (ref 8.9–10.3)
Chloride: 101 mmol/L (ref 98–111)
Creatinine, Ser: 0.96 mg/dL (ref 0.44–1.00)
GFR, Estimated: >60 mL/min
Glucose, Bld: 217 mg/dL — ABNORMAL HIGH (ref 70–99)
Potassium: 3.1 mmol/L — ABNORMAL LOW (ref 3.5–5.1)
Sodium: 140 mmol/L (ref 135–145)
Total Bilirubin: 0.5 mg/dL (ref 0.0–1.2)
Total Protein: 7.7 g/dL (ref 6.5–8.1)

## 2024-07-27 LAB — I-STAT CHEM 8, ED
BUN: 30 mg/dL — ABNORMAL HIGH (ref 8–23)
Calcium, Ion: 1.27 mmol/L (ref 1.15–1.40)
Chloride: 106 mmol/L (ref 98–111)
Creatinine, Ser: 1 mg/dL (ref 0.44–1.00)
Glucose, Bld: 204 mg/dL — ABNORMAL HIGH (ref 70–99)
HCT: 40 % (ref 36.0–46.0)
Hemoglobin: 13.6 g/dL (ref 12.0–15.0)
Potassium: 2.9 mmol/L — ABNORMAL LOW (ref 3.5–5.1)
Sodium: 142 mmol/L (ref 135–145)
TCO2: 20 mmol/L — ABNORMAL LOW (ref 22–32)

## 2024-07-27 LAB — BLOOD GAS, VENOUS
Acid-base deficit: 6.9 mmol/L — ABNORMAL HIGH (ref 0.0–2.0)
Bicarbonate: 18.5 mmol/L — ABNORMAL LOW (ref 20.0–28.0)
O2 Saturation: 59.7 %
Patient temperature: 37
pCO2, Ven: 36 mmHg — ABNORMAL LOW (ref 44–60)
pH, Ven: 7.32 (ref 7.25–7.43)
pO2, Ven: 37 mmHg (ref 32–45)

## 2024-07-27 LAB — RESP PANEL BY RT-PCR (RSV, FLU A&B, COVID)  RVPGX2
Influenza A by PCR: POSITIVE — AB
Influenza B by PCR: NEGATIVE
Resp Syncytial Virus by PCR: NEGATIVE
SARS Coronavirus 2 by RT PCR: NEGATIVE

## 2024-07-27 LAB — PRO BRAIN NATRIURETIC PEPTIDE: Pro Brain Natriuretic Peptide: 2012 pg/mL — ABNORMAL HIGH

## 2024-07-27 MED ORDER — SENNA 8.6 MG PO TABS: 1.0000 | ORAL_TABLET | Freq: Two times a day (BID) | ORAL | Status: AC | PRN

## 2024-07-27 MED ORDER — ALBUTEROL SULFATE (2.5 MG/3ML) 0.083% IN NEBU
INHALATION_SOLUTION | RESPIRATORY_TRACT | Status: AC
  Administered 2024-07-27: 2.5 mg via RESPIRATORY_TRACT
  Filled 2024-07-27: qty 6

## 2024-07-27 MED ORDER — MAGNESIUM SULFATE 2 GM/50ML IV SOLN
2.0000 g | Freq: Once | INTRAVENOUS | Status: AC
  Administered 2024-07-27: 2 g via INTRAVENOUS
  Filled 2024-07-27: qty 50

## 2024-07-27 MED ORDER — POTASSIUM CHLORIDE 10 MEQ/100ML IV SOLN
10.0000 meq | INTRAVENOUS | Status: AC
  Administered 2024-07-27 (×2): 10 meq via INTRAVENOUS
  Filled 2024-07-27 (×2): qty 100

## 2024-07-27 MED ORDER — LORAZEPAM 2 MG/ML IJ SOLN
0.5000 mg | Freq: Once | INTRAMUSCULAR | Status: AC
  Administered 2024-07-27: 0.5 mg via INTRAVENOUS
  Filled 2024-07-27: qty 1

## 2024-07-27 MED ORDER — IPRATROPIUM-ALBUTEROL 0.5-2.5 (3) MG/3ML IN SOLN: 3.0000 mL | Freq: Four times a day (QID) | RESPIRATORY_TRACT | Status: AC | PRN

## 2024-07-27 MED ORDER — SODIUM CHLORIDE 0.9 % IV SOLN: 100.0000 mg | Freq: Two times a day (BID) | INTRAVENOUS | Status: DC

## 2024-07-27 MED ORDER — CHLORHEXIDINE GLUCONATE CLOTH 2 % EX PADS: 6.0000 | MEDICATED_PAD | Freq: Every day | CUTANEOUS | Status: AC

## 2024-07-27 MED ORDER — REVEFENACIN 175 MCG/3ML IN SOLN
175.0000 ug | Freq: Every day | RESPIRATORY_TRACT | Status: AC
  Filled 2024-07-27: qty 3

## 2024-07-27 MED ORDER — POTASSIUM CHLORIDE CRYS ER 20 MEQ PO TBCR
40.0000 meq | EXTENDED_RELEASE_TABLET | Freq: Once | ORAL | Status: AC
  Administered 2024-07-27: 40 meq via ORAL
  Filled 2024-07-27: qty 2

## 2024-07-27 MED ORDER — POLYETHYLENE GLYCOL 3350 17 G PO PACK: 17.0000 g | PACK | Freq: Every day | ORAL | Status: AC | PRN

## 2024-07-27 MED ORDER — SODIUM CHLORIDE 0.9 % IV SOLN
100.0000 mg | Freq: Two times a day (BID) | INTRAVENOUS | Status: AC
  Administered 2024-07-27: 100 mg via INTRAVENOUS
  Filled 2024-07-27: qty 100

## 2024-07-27 MED ORDER — METHYLPREDNISOLONE SODIUM SUCC 40 MG IJ SOLR: 40.0000 mg | Freq: Two times a day (BID) | INTRAMUSCULAR | Status: AC

## 2024-07-27 MED ORDER — ENOXAPARIN SODIUM 40 MG/0.4ML IJ SOSY: 40.0000 mg | PREFILLED_SYRINGE | Freq: Every day | INTRAMUSCULAR | Status: AC

## 2024-07-27 MED ORDER — NITROGLYCERIN IN D5W 200-5 MCG/ML-% IV SOLN
0.0000 ug/min | INTRAVENOUS | Status: AC
  Administered 2024-07-27: 5 ug/min via INTRAVENOUS
  Filled 2024-07-27: qty 250

## 2024-07-27 MED ORDER — ALBUTEROL SULFATE (2.5 MG/3ML) 0.083% IN NEBU
2.5000 mg | INHALATION_SOLUTION | Freq: Once | RESPIRATORY_TRACT | Status: AC
  Administered 2024-07-27: 2.5 mg via RESPIRATORY_TRACT

## 2024-07-27 MED ORDER — ARFORMOTEROL TARTRATE 15 MCG/2ML IN NEBU
15.0000 ug | INHALATION_SOLUTION | Freq: Two times a day (BID) | RESPIRATORY_TRACT | Status: AC
  Administered 2024-07-27: 15 ug via RESPIRATORY_TRACT
  Filled 2024-07-27: qty 2

## 2024-07-27 MED ORDER — AZITHROMYCIN 250 MG PO TABS: 500.0000 mg | ORAL_TABLET | Freq: Once | ORAL | Status: DC

## 2024-07-27 MED ORDER — SODIUM CHLORIDE 0.9 % IV SOLN: 2.0000 g | INTRAVENOUS | Status: AC

## 2024-07-27 MED ORDER — SODIUM CHLORIDE 0.9 % IV SOLN
1.0000 g | INTRAVENOUS | Status: DC
  Administered 2024-07-27: 1 g via INTRAVENOUS
  Filled 2024-07-27: qty 10

## 2024-07-27 MED ORDER — OSELTAMIVIR PHOSPHATE 30 MG PO CAPS
30.0000 mg | ORAL_CAPSULE | Freq: Two times a day (BID) | ORAL | Status: AC
  Administered 2024-07-27: 30 mg via ORAL
  Filled 2024-07-27: qty 1

## 2024-07-27 MED ORDER — BUDESONIDE 0.25 MG/2ML IN SUSP
0.2500 mg | Freq: Two times a day (BID) | RESPIRATORY_TRACT | Status: AC
  Administered 2024-07-27: 0.25 mg via RESPIRATORY_TRACT
  Filled 2024-07-27: qty 2

## 2024-07-27 NOTE — ED Notes (Signed)
 RT to bedside

## 2024-07-27 NOTE — ED Triage Notes (Addendum)
 Pt to ED via [facility] from home c/o SHOB x 1 week, progressively getting worse, pt has known flu, on EMS arrival 74% RA o2 sats. HX COPD. Not home o2 user. Pt is very anxious.   Medications given by EMS: 125MG  solumedrol. Duoneb,   #20 LAC  212/124, HR 120, 99%nrb, cbg 264, RR 35

## 2024-07-27 NOTE — ED Notes (Signed)
 EDP Dasie MD aware of pt HR

## 2024-07-27 NOTE — ED Notes (Signed)
 Patient taken off Bipa to give PO meds per intervenist. Patient was placed on 15L NRB during break from BiPAP due to medication administration.

## 2024-07-27 NOTE — H&P (Signed)
 "  NAME:  [name], MRN:  [id number], DOB:  1960-12-28, LOS: 0 ADMISSION DATE:  07/27/2024 CHIEF COMPLAINT:  SOB.    History of Present Illness:  36 female with COPD presents for shortness of breath.  Diagnosed with flu recently.  On arrival to ED SpO2 74% on room air In ED was given Solu-Medrol  albuterol  and placed on BiPAP.  Blood pressure elevated on arrival at 216/115 and currently on 40 of nitroglycerin  drip.  Labs: Potassium 2.9, BUN 30, BNP 2000, flu a positive. Chest x-ray with right infrahilar opacity.  Likely pneumonia.  Patient is alert and oriented x 3.  She says she has been diagnosed with flu for last 1-2 weeks.  Denies taking Tamiflu . She is an ex-smoker.  She says that she currently does not take any inhalers though Trelegy has been listed in her home meds from outpatient providers notes. She is feeling better currently on BiPAP.  Interim History / Subjective:  See above.   Significant Hospital Events: Admitted ICU 2/6.   Objective    Blood pressure (!) 181/103, pulse (!) 121, temperature 98.4 F (36.9 C), temperature source Axillary, resp. rate (!) 31, SpO2 97%.    FiO2 (%):  [35 %] 35 %  No intake or output data in the 24 hours ending 07/27/24 2034 There were no vitals filed for this visit.  Examination: General: Elderly lady currently not in distress and on BiPAP. Lungs: clear to auscultation bilaterally.  Heart: regular rate rhythm, no murmur appreciated.  Abdomen: non tender, non distended. Normal BS.  Neuro: axox 3.  Moving all extremities. Dry mucous membrane.  Does not appear fluid overloaded.   Assessment and Plan  Acute hypoxic respiratory failure: Flu A Community-acquired pneumonia: COPD exacerbation: - Continue BiPAP. - Ceftriaxone  doxycycline . - Tamiflu . - Triple nebs, steroids and DuoNebs as needed. - Infectious workup with MRSA screen, Legionella, strep, blood cultures and sputum cultures.  Hypertensive urgency: - SBP 215 on  arrival. - Continue with nitro drip for now. - Goal SBP less than 180. - Tomorrow goal SBP 140-160 and then less than 140. - Not on blood pressure medications at home. - ?  Opiate withdrawal. - Will need to clarify home medications.  Once home medications clarified Slowly resume back home opiates. - Will add BP meds tomorrow if needed.  Hypokalemia: - Replace as needed.  Replacement protocol ordered.  Elevated BNP: - Clinically not in fluid overload. - Echo  Full code. Lovenox  and SCD.  Patient Lines/Drains/Airways Status     Active Line/Drains/Airways     Name Placement date Placement time Site Days   Peripheral IV 07/27/24 20 G Left Antecubital 07/27/24  1711  Antecubital  less than 1   Peripheral IV 07/27/24 22 G Left Forearm 07/27/24  1815  Forearm  less than 1            Labs   CBC: Recent Labs  Lab 07/27/24 1716 07/27/24 1937  WBC 6.2  --   NEUTROABS 4.7  --   HGB 12.5 13.6  HCT 41.4 40.0  MCV 85.5  --   PLT 212  --     Basic Metabolic Panel: Recent Labs  Lab 07/27/24 1716 07/27/24 1937  NA 140 142  K 3.1* 2.9*  CL 101 106  CO2 19*  --   GLUCOSE 217* 204*  BUN 27* 30*  CREATININE 0.96 1.00  CALCIUM 10.3  --    GFR: CrCl cannot be calculated (Unknown ideal weight.). Recent Labs  Lab 07/27/24 1716  WBC 6.2    Liver Function Tests: Recent Labs  Lab 07/27/24 1716  AST 25  ALT 21  ALKPHOS 99  BILITOT 0.5  PROT 7.7  ALBUMIN 3.6   No results for input(s): LIPASE, AMYLASE in the last 168 hours. No results for input(s): AMMONIA in the last 168 hours.  ABG    Component Value Date/Time   HCO3 18.5 (L) 07/27/2024 1818   TCO2 20 (L) 07/27/2024 1937   ACIDBASEDEF 6.9 (H) 07/27/2024 1818   O2SAT 59.7 07/27/2024 1818      Past Medical History:  She,  has a past medical history of Abnormal brain MRI (07/26/2013), Anxiety, Arthritis, Atypical facial pain, Chronic back pain, Foot fracture, right, and RSD upper limb (03/07/2014).    Surgical History:   Past Surgical History:  Procedure Laterality Date   ABDOMINAL HYSTERECTOMY  2012     Social History:   reports that she has been smoking cigarettes. She has never used smokeless tobacco. She reports current alcohol use. She reports that she does not use drugs.   Family History:  Her family history includes Congestive Heart Failure in her mother; Coronary artery disease in her father; Hypertension in her brother; Prostate cancer in her sister.   Allergies Allergies[1]   Home Medications  Prior to Admission medications  Medication Sig Start Date End Date Taking? Authorizing Provider  ALPRAZolam  (XANAX ) 0.5 MG tablet Take 1-2 tablets prior to MRI 1 additional tablet can be used as the scan starts if needed. MUST HAVE DRIVER 88/1/76   Onita Duos, MD  ALPRAZolam  (XANAX ) 0.5 MG tablet Take 1-2 tablets 30 minutes prior to MRI, may repeat once as needed. Must have driver. 01/31/23   Onita Duos, MD  calcium carbonate (TUMS - DOSED IN MG ELEMENTAL CALCIUM) 500 MG chewable tablet Chew 1 tablet by mouth daily. OTC PRN    [provider]  cetirizine (ZYRTEC) 10 MG tablet Take 10 mg by mouth daily as needed for allergies. OTC PRN    [provider]  fenofibrate (TRICOR) 145 MG tablet Take 145 mg by mouth daily. with food 01/25/17   [provider]  folic acid  (FOLVITE ) 1 MG tablet Take 1 tablet (1 mg total) by mouth daily. 06/07/22   Onita Duos, MD  gabapentin  (NEURONTIN ) 300 MG capsule TAKE 3 CAPSULES BY MOUTH 3 TIMES DAILY. 04/09/24   Onita Duos, MD  methotrexate  (RHEUMATREX) 2.5 MG tablet Take 6 tablets (15 mg total) by mouth once a week. Caution:Chemotherapy. Protect from light. 04/06/23   Onita Duos, MD  OXcarbazepine  (TRILEPTAL ) 150 MG tablet Take 1 tablet (150 mg total) by mouth 2 (two) times daily. 04/06/23   Onita Duos, MD  oxyCODONE-acetaminophen  (PERCOCET) 7.5-325 MG tablet Take 1 tablet by mouth 3 (three) times daily as needed. 05/12/22    [provider]  predniSONE  (DELTASONE ) 20 MG tablet TAKE 1 TABLET BY MOUTH DAILY WITH BREAKFAST 07/09/24   Gayland Lauraine PARAS, NP  sertraline (ZOLOFT) 100 MG tablet Take 100 mg by mouth daily. 08/20/21   [provider]  topiramate  (TOPAMAX ) 50 MG tablet TAKE 1 AND 1/2 TABLETS BY MOUTH TWICE DAILY 04/23/24   Yan, Yijun, MD  triamcinolone cream (KENALOG) 0.1 % Apply 1 application topically 2 (two) times daily.  11/15/12   [provider]  XTAMPZA ER 18 MG C12A Take 1 capsule by mouth 2 (two) times daily. 08/25/21   [provider]     CRITICAL CARE Performed by: Sammi BIRCH  Dejon Jungman.     Total critical care time: 50 minutes   Critical care time was exclusive of separately billable procedures and treating other patients.   Critical care was necessary to treat or prevent imminent or life-threatening deterioration.   Critical care was time spent personally by me on the following activities: development of treatment plan with patient and/or surrogate as well as nursing, discussions with consultants, evaluation of patient's response to treatment, examination of patient, obtaining history from patient or surrogate, ordering and performing treatments and interventions, ordering and review of laboratory studies, ordering and review of radiographic studies, pulse oximetry, re-evaluation of patient's condition and participation in multidisciplinary rounds.  Sammi JONETTA Fredericks, MD Pulmonary, Critical Care and Sleep Attending.  Pager: [phone]  07/27/2024, 8:34 PM          [1]  Allergies Allergen Reactions   Codeine Itching   Prednisone  Other (See Comments)    Intolerant to high doses   "

## 2024-07-27 NOTE — ED Provider Notes (Signed)
 " [place] EMERGENCY DEPARTMENT AT [facility] Provider Note   CSN: [id number] Arrival date & time: 07/27/24  1655     Patient presents with: Respiratory Distress   [name] is a 64 y.o. female.   64 year old female who presents shortness of breath.  History of COPD and has been short of breath for about a week now.  Patient diagnosed recently with flu.  EMS was called and patient's O2 sats was 74%.  She does not use home O2.  Patient very anxious.  Attempted CPAP without success.  Patient found be hypertensive.  No treatment for that done.  Patient given Solu-Medrol  125, albuterol  with Atrovent.  Placed on oxygen and transported here       Prior to Admission medications  Medication Sig Start Date End Date Taking? Authorizing Provider  ALPRAZolam  (XANAX ) 0.5 MG tablet Take 1-2 tablets prior to MRI 1 additional tablet can be used as the scan starts if needed. MUST HAVE DRIVER Patient not taking: Reported on 01/10/2024 04/28/22   Onita Duos, MD  ALPRAZolam  (XANAX ) 0.5 MG tablet Take 1-2 tablets 30 minutes prior to MRI, may repeat once as needed. Must have driver. Patient not taking: Reported on 01/10/2024 01/31/23   Onita Duos, MD  calcium carbonate (TUMS - DOSED IN MG ELEMENTAL CALCIUM) 500 MG chewable tablet Chew 1 tablet by mouth daily. OTC PRN    [provider]  cetirizine (ZYRTEC) 10 MG tablet Take 10 mg by mouth daily as needed for allergies. OTC PRN    [provider]  fenofibrate (TRICOR) 145 MG tablet Take 145 mg by mouth daily. with food 01/25/17   [provider]  folic acid  (FOLVITE ) 1 MG tablet Take 1 tablet (1 mg total) by mouth daily. 06/07/22   Onita Duos, MD  gabapentin  (NEURONTIN ) 300 MG capsule TAKE 3 CAPSULES BY MOUTH 3 TIMES DAILY. 04/09/24   Onita Duos, MD  methotrexate  (RHEUMATREX) 2.5 MG tablet Take 6 tablets (15 mg total) by mouth once a week. Caution:Chemotherapy. Protect from light. Patient not taking: Reported on 01/10/2024  04/06/23   Onita Duos, MD  OXcarbazepine  (TRILEPTAL ) 150 MG tablet Take 1 tablet (150 mg total) by mouth 2 (two) times daily. 04/06/23   Onita Duos, MD  oxyCODONE-acetaminophen  (PERCOCET) 7.5-325 MG tablet Take 1 tablet by mouth 3 (three) times daily as needed. 05/12/22   [provider]  predniSONE  (DELTASONE ) 20 MG tablet TAKE 1 TABLET BY MOUTH DAILY WITH BREAKFAST 07/09/24   Gayland Lauraine PARAS, NP  sertraline (ZOLOFT) 100 MG tablet Take 100 mg by mouth daily. 08/20/21   [provider]  topiramate  (TOPAMAX ) 50 MG tablet TAKE 1 AND 1/2 TABLETS BY MOUTH TWICE DAILY 04/23/24   Yan, Yijun, MD  triamcinolone cream (KENALOG) 0.1 % Apply 1 application topically 2 (two) times daily.  11/15/12   [provider]  XTAMPZA ER 18 MG C12A Take 1 capsule by mouth 2 (two) times daily. 08/25/21   [provider]    Allergies: Codeine and Prednisone     Review of Systems  All other systems reviewed and are negative.   Updated Vital Signs There were no vitals taken for this visit.  Physical Exam Vitals and nursing note reviewed.  Constitutional:      General: She is not in acute distress.    Appearance: Normal appearance. She is well-developed. She is not toxic-appearing.  HENT:     Head: Normocephalic and atraumatic.  Eyes:     General: Lids  are normal.     Conjunctiva/sclera: Conjunctivae normal.     Pupils: Pupils are equal, round, and reactive to light.  Neck:     Thyroid: No thyroid mass.     Trachea: No tracheal deviation.  Cardiovascular:     Rate and Rhythm: Regular rhythm. Tachycardia present.     Heart sounds: Normal heart sounds. No murmur heard.    No gallop.  Pulmonary:     Effort: Tachypnea, accessory muscle usage and respiratory distress present.     Breath sounds: No stridor. Examination of the right-upper field reveals decreased breath sounds. Examination of the left-upper field reveals decreased breath sounds. Examination of the right-lower field  reveals rhonchi. Examination of the left-lower field reveals rhonchi. Decreased breath sounds and rhonchi present. No wheezing or rales.  Abdominal:     General: There is no distension.     Palpations: Abdomen is soft.     Tenderness: There is no abdominal tenderness. There is no rebound.  Musculoskeletal:        General: No tenderness. Normal range of motion.     Cervical back: Normal range of motion and neck supple.  Skin:    General: Skin is warm and dry.     Findings: No abrasion or rash.  Neurological:     General: No focal deficit present.     Mental Status: She is alert and oriented to person, place, and time. Mental status is at baseline.     GCS: GCS eye subscore is 4. GCS verbal subscore is 5. GCS motor subscore is 6.     Cranial Nerves: No cranial nerve deficit.     Sensory: No sensory deficit.     Motor: Motor function is intact.  Psychiatric:        Attention and Perception: Attention normal.        Mood and Affect: Mood is anxious.     (all labs ordered are listed, but only abnormal results are displayed) Labs Reviewed  RESP PANEL BY RT-PCR (RSV, FLU A&B, COVID)  [id number]  CBC WITH DIFFERENTIAL/PLATELET  COMPREHENSIVE METABOLIC PANEL WITH GFR  PRO BRAIN NATRIURETIC PEPTIDE  I-STAT CHEM 8, ED    EKG: EKG Interpretation Date/Time:  Friday July 27 2024 18:45:47 EST Ventricular Rate:  137 PR Interval:  131 QRS Duration:  82 QT Interval:  303 QTC Calculation: 458 R Axis:   64  Text Interpretation: Sinus tachycardia Paired ventricular premature complexes Consider right atrial enlargement Anterior infarct, old Nonspecific repol abnormality, lateral leads Confirmed by Dasie Faden ([id number]) on 07/27/2024 7:13:36 PM  Radiology: No results found.   Procedures   Medications Ordered in the ED  albuterol  (PROVENTIL ) (2.5 MG/3ML) 0.083% nebulizer solution 2.5 mg (has no administration in time range)  albuterol  (PROVENTIL ) (2.5 MG/3ML) 0.083% nebulizer solution  (has no administration in time range)  magnesium  sulfate IVPB 2 g 50 mL (has no administration in time range)                                    Medical Decision Making Amount and/or Complexity of Data Reviewed Labs: ordered. Radiology: ordered. ECG/medicine tests: ordered.  Risk Prescription drug management.   Patient is EKG shows sinus tachycardia.  Patient respiratory stress here.  Placed on BiPAP which is tolerated well.  Patient had received albuterol  treatment prior to arrival here.  Patient given magnesium  here.  She did receive steroids prior to arrival.  Chest x-ray concerning for pneumonia.  Her respiratory panel is positive for flu A.  Her venous blood gas shows no evidence of CO2 retention.  She is awake and alert and following commands. BNP is elevated here.  She is hypertensive.  Placed on nitroglycerin  drip.  Mild hypokalemia noted and patient will be given IV potassium.  Plan will be for ICU admission and will consult intensivist  CRITICAL CARE Performed by: Curtistine ONEIDA Dawn Total critical care time: 70 minutes Critical care time was exclusive of separately billable procedures and treating other patients. Critical care was necessary to treat or prevent imminent or life-threatening deterioration. Critical care was time spent personally by me on the following activities: development of treatment plan with patient and/or surrogate as well as nursing, discussions with consultants, evaluation of patient's response to treatment, examination of patient, obtaining history from patient or surrogate, ordering and performing treatments and interventions, ordering and review of laboratory studies, ordering and review of radiographic studies, pulse oximetry and re-evaluation of patient's condition.     Final diagnoses:  None    ED Discharge Orders     None          Dawn Curtistine, MD 07/27/24 1915  "

## 2024-07-27 NOTE — ED Notes (Signed)
 RT consulted for breathing treatment

## 2024-07-27 NOTE — ED Notes (Signed)
 RT to place pt on bipap per EDP [place] order

## 2024-07-27 NOTE — ED Notes (Signed)
 Pt is very restless, attending made aware

## 2024-09-18 ENCOUNTER — Ambulatory Visit: Admitting: Neurology
